# Patient Record
Sex: Female | Born: 1980 | Race: White | Hispanic: No | Marital: Single | Smoking: Current every day smoker
Health system: Southern US, Community
[De-identification: ages and names within clinical notes are randomized; demographics above are authoritative.]

## PROBLEM LIST (undated history)

## (undated) DIAGNOSIS — B1920 Unspecified viral hepatitis C without hepatic coma: Secondary | ICD-10-CM

## (undated) DIAGNOSIS — F419 Anxiety disorder, unspecified: Secondary | ICD-10-CM

---

## 2010-12-21 ENCOUNTER — Emergency Department (HOSPITAL_COMMUNITY)
Admission: EM | Admit: 2010-12-21 | Discharge: 2010-12-21 | Disposition: A | Discharge status: Home / Self Care | Payer: Medicaid Other | Attending: Emergency Medicine | Admitting: Emergency Medicine

## 2010-12-21 DIAGNOSIS — N760 Acute vaginitis: Secondary | ICD-10-CM | POA: Insufficient documentation

## 2010-12-21 DIAGNOSIS — N12 Tubulo-interstitial nephritis, not specified as acute or chronic: Secondary | ICD-10-CM | POA: Insufficient documentation

## 2010-12-21 DIAGNOSIS — R6883 Chills (without fever): Secondary | ICD-10-CM | POA: Insufficient documentation

## 2010-12-21 DIAGNOSIS — M545 Low back pain, unspecified: Secondary | ICD-10-CM | POA: Insufficient documentation

## 2010-12-21 DIAGNOSIS — B9689 Other specified bacterial agents as the cause of diseases classified elsewhere: Secondary | ICD-10-CM | POA: Insufficient documentation

## 2010-12-21 DIAGNOSIS — R109 Unspecified abdominal pain: Secondary | ICD-10-CM | POA: Insufficient documentation

## 2010-12-21 DIAGNOSIS — A499 Bacterial infection, unspecified: Secondary | ICD-10-CM | POA: Insufficient documentation

## 2010-12-21 DIAGNOSIS — R110 Nausea: Secondary | ICD-10-CM | POA: Insufficient documentation

## 2010-12-21 LAB — URINE MICROSCOPIC-ADD ON

## 2010-12-21 LAB — URINALYSIS, ROUTINE W REFLEX MICROSCOPIC
Bilirubin Urine: NEGATIVE
Glucose, UA: NEGATIVE mg/dL
Ketones, ur: NEGATIVE mg/dL
pH: 7 (ref 5.0–8.0)

## 2010-12-21 LAB — POCT PREGNANCY, URINE: Preg Test, Ur: NEGATIVE

## 2010-12-21 LAB — WET PREP, GENITAL: Yeast Wet Prep HPF POC: NONE SEEN

## 2014-02-12 ENCOUNTER — Encounter (HOSPITAL_COMMUNITY): Payer: Self-pay | Admitting: Emergency Medicine

## 2014-02-12 DIAGNOSIS — Z3202 Encounter for pregnancy test, result negative: Secondary | ICD-10-CM | POA: Insufficient documentation

## 2014-02-12 DIAGNOSIS — Z8659 Personal history of other mental and behavioral disorders: Secondary | ICD-10-CM | POA: Insufficient documentation

## 2014-02-12 DIAGNOSIS — F1210 Cannabis abuse, uncomplicated: Secondary | ICD-10-CM | POA: Insufficient documentation

## 2014-02-12 DIAGNOSIS — F1120 Opioid dependence, uncomplicated: Secondary | ICD-10-CM | POA: Insufficient documentation

## 2014-02-12 DIAGNOSIS — F1410 Cocaine abuse, uncomplicated: Secondary | ICD-10-CM | POA: Insufficient documentation

## 2014-02-12 LAB — COMPREHENSIVE METABOLIC PANEL
ALBUMIN: 3.7 g/dL (ref 3.5–5.2)
ALK PHOS: 74 U/L (ref 39–117)
ALT: 49 U/L — ABNORMAL HIGH (ref 0–35)
AST: 45 U/L — AB (ref 0–37)
BUN: 11 mg/dL (ref 6–23)
CO2: 25 mEq/L (ref 19–32)
Calcium: 9.8 mg/dL (ref 8.4–10.5)
Chloride: 101 mEq/L (ref 96–112)
Creatinine, Ser: 0.98 mg/dL (ref 0.50–1.10)
GFR calc Af Amer: 88 mL/min — ABNORMAL LOW (ref >90)
GFR calc non Af Amer: 76 mL/min — ABNORMAL LOW (ref >90)
Glucose, Bld: 91 mg/dL (ref 70–99)
POTASSIUM: 4.1 meq/L (ref 3.7–5.3)
SODIUM: 140 meq/L (ref 137–147)
TOTAL PROTEIN: 8.1 g/dL (ref 6.0–8.3)
Total Bilirubin: 0.5 mg/dL (ref 0.3–1.2)

## 2014-02-12 LAB — RAPID URINE DRUG SCREEN, HOSP PERFORMED
Amphetamines: NOT DETECTED
BENZODIAZEPINES: NOT DETECTED
Barbiturates: NOT DETECTED
COCAINE: POSITIVE — AB
OPIATES: POSITIVE — AB
TETRAHYDROCANNABINOL: POSITIVE — AB

## 2014-02-12 LAB — CBC
HEMATOCRIT: 38.5 % (ref 36.0–46.0)
Hemoglobin: 12.8 g/dL (ref 12.0–15.0)
MCH: 28.8 pg (ref 26.0–34.0)
MCHC: 33.2 g/dL (ref 30.0–36.0)
MCV: 86.5 fL (ref 78.0–100.0)
PLATELETS: 278 10*3/uL (ref 150–400)
RBC: 4.45 MIL/uL (ref 3.87–5.11)
RDW: 12.6 % (ref 11.5–15.5)
WBC: 6.7 10*3/uL (ref 4.0–10.5)

## 2014-02-12 LAB — ACETAMINOPHEN LEVEL

## 2014-02-12 LAB — SALICYLATE LEVEL: Salicylate Lvl: <2 mg/dL — ABNORMAL LOW (ref 2.8–20.0)

## 2014-02-12 LAB — ETHANOL

## 2014-02-12 NOTE — ED Notes (Signed)
No answer for triage.

## 2014-02-12 NOTE — ED Notes (Signed)
Pt reports she has probation and just used heroine a few hours ago. She says she is ready to get help.

## 2014-02-13 ENCOUNTER — Emergency Department (HOSPITAL_COMMUNITY)
Admission: EM | Admit: 2014-02-13 | Discharge: 2014-02-14 | Disposition: A | Discharge status: Rehabilitation Facility | Payer: Medicaid Other | Attending: Emergency Medicine | Admitting: Emergency Medicine

## 2014-02-13 DIAGNOSIS — F1120 Opioid dependence, uncomplicated: Secondary | ICD-10-CM

## 2014-02-13 DIAGNOSIS — F1910 Other psychoactive substance abuse, uncomplicated: Secondary | ICD-10-CM

## 2014-02-13 HISTORY — DX: Anxiety disorder, unspecified: F41.9

## 2014-02-13 LAB — URINE MICROSCOPIC-ADD ON

## 2014-02-13 LAB — URINALYSIS, ROUTINE W REFLEX MICROSCOPIC
BILIRUBIN URINE: NEGATIVE
Glucose, UA: NEGATIVE mg/dL
HGB URINE DIPSTICK: NEGATIVE
Ketones, ur: NEGATIVE mg/dL
Nitrite: NEGATIVE
PH: 5.5 (ref 5.0–8.0)
Protein, ur: NEGATIVE mg/dL
SPECIFIC GRAVITY, URINE: 1.01 (ref 1.005–1.030)
Urobilinogen, UA: 0.2 mg/dL (ref 0.0–1.0)

## 2014-02-13 LAB — PREGNANCY, URINE: Preg Test, Ur: NEGATIVE

## 2014-02-13 MED ORDER — METHOCARBAMOL 500 MG PO TABS
500.0000 mg | ORAL_TABLET | Freq: Three times a day (TID) | ORAL | Status: DC | PRN
  Administered 2014-02-13: 500 mg via ORAL
  Filled 2014-02-13: qty 1

## 2014-02-13 MED ORDER — ONDANSETRON 4 MG PO TBDP: 4.0000 mg | ORAL_TABLET | Freq: Four times a day (QID) | ORAL | Status: DC | PRN

## 2014-02-13 MED ORDER — ALUM & MAG HYDROXIDE-SIMETH 200-200-20 MG/5ML PO SUSP: 30.0000 mL | ORAL | Status: DC | PRN

## 2014-02-13 MED ORDER — HYDROXYZINE HCL 25 MG PO TABS: 25.0000 mg | ORAL_TABLET | Freq: Four times a day (QID) | ORAL | Status: DC | PRN

## 2014-02-13 MED ORDER — NICOTINE 21 MG/24HR TD PT24
21.0000 mg | MEDICATED_PATCH | Freq: Every day | TRANSDERMAL | Status: DC
  Administered 2014-02-13: 21 mg via TRANSDERMAL
  Filled 2014-02-13: qty 1

## 2014-02-13 MED ORDER — NAPROXEN 250 MG PO TABS: 500.0000 mg | ORAL_TABLET | Freq: Two times a day (BID) | ORAL | Status: DC | PRN

## 2014-02-13 MED ORDER — CLONIDINE HCL 0.1 MG PO TABS: 0.1000 mg | ORAL_TABLET | Freq: Every day | ORAL | Status: DC

## 2014-02-13 MED ORDER — DICYCLOMINE HCL 20 MG PO TABS
20.0000 mg | ORAL_TABLET | Freq: Four times a day (QID) | ORAL | Status: DC | PRN
  Administered 2014-02-13: 20 mg via ORAL
  Filled 2014-02-13: qty 1

## 2014-02-13 MED ORDER — LOPERAMIDE HCL 2 MG PO CAPS: 2.0000 mg | ORAL_CAPSULE | ORAL | Status: DC | PRN

## 2014-02-13 MED ORDER — CLONIDINE HCL 0.1 MG PO TABS
0.1000 mg | ORAL_TABLET | Freq: Four times a day (QID) | ORAL | Status: DC
  Administered 2014-02-13 (×2): 0.1 mg via ORAL
  Filled 2014-02-13 (×2): qty 1

## 2014-02-13 MED ORDER — CLONIDINE HCL 0.1 MG PO TABS: 0.1000 mg | ORAL_TABLET | ORAL | Status: DC

## 2014-02-13 NOTE — BH Assessment (Signed)
[facility] Assessment Progress Note  Pt is under consideration for admission to [facility].  At 16:08 I called in response to their concerns and spoke to [place].  I informed her that pt is reportedly represented by an unnamed [employer] administrator in [employer] for her upcoming court date.  Joni Reining reports that pt's vital signs will need to be addressed.  Pt's nurse at [facility] has been informed, and she will discuss the matter with the EDP.  I agreed to continue checking vital signs posted in EPIC.  Doylene Canning, MA Triage Specialist 02/13/2014 @ 16:22

## 2014-02-13 NOTE — BH Assessment (Signed)
Faxed updated list of vitals to [facility].  Harlin Rain Ria Comment, [facility] Triage Specialist [phone]

## 2014-02-13 NOTE — BH Assessment (Signed)
B[facility] Assessment Progress Note  Joni Reining at [facility] reports that generally pts must have a pulse of at least 70 and blood pressure of at least 110/70 in order for [facility] to be able to manage them.  At 17:45 I spoke to pt's nurse at [facility], and informed her.   Doylene Canning, MA  Triage Specialist  02/13/2014 @ 17:51

## 2014-02-13 NOTE — BH Assessment (Addendum)
Tele Assessment Note   Kathleen Frederick is an 33 y.o. female, single , Caucasian who presents unaccompanied to [facility] requesting detoxification from heroin. Pt reports she has been using approximately 2-3 grams of heroin intravenously daily for the past three years. She denies using cocaine even though UDS is positive. Pt reports smoking marijuana rarely and says she smoke one blunt a few days ago but before that it had been several months. Pt reports she has a history of withdrawal including cramps, sweats, chills, nausea, vomiting and diarrhea. She denies history of seizures. She denies regular alcohol use. Pt states she is not prescribed any medications at this time but says she has been on Paxil and Xanax in the past for anxiety. Pt reports her longest period of sobriety was 120 when she was incarcerated for drug offenses. Pt denies depressive symptoms and describes her mood as "pretty good." She denies current suicidal ideation or any history of suicidal gestures. She denies intentional self-injurious behaviors. She denies homicidal ideation or history of violence. She denies any psychotic symptoms.   Pt reports she is seeking treatment at this time because she talked to her probation officer and decided her life has become unmanageable. She reports she has charges for possession of drug paraphernalia. She is living alone in a hotel. She says she is isolating herself and is tired all the time. She state she twin four year old boys who are being care for by her mother. She feels bad she cannot care for them herself.Patient reports that she went to detox program back in February at [facility] and was clean for 54 days. She has had outpatient treatment in the past for anxiety but currently has not providers.   Pt is dressed on a hospital gown, drowsy, oriented x4 with slow, slurred speech and slow motor behavior. Eye contact is poor as Pt has difficulty keeping her eyes open because she is  drowsy. Pt's mood is guilty and affect is congruent with mood. Thought process is coherent and relevant. There is no indication Pt is currently responding to internal stimuli or experiencing delusional thought content. Pt states she is ready for treatment.  Axis I: 304.00 Opioid Use Disorder, Severe Axis II: Deferred Axis III:  Past Medical History  Diagnosis Date  . Anxiety    Axis IV: economic problems, housing problems, occupational problems, other psychosocial or environmental problems and problems related to legal system/crime Axis V: GAF=35  Past Medical History:  Past Medical History  Diagnosis Date  . Anxiety     No past surgical history on file.  Family History: No family history on file.  Social History:  has no tobacco, alcohol, and drug history on file.  Additional Social History:  Alcohol / Drug Use Pain Medications: Denies abuse Prescriptions: Denies abuse Over the Counter:  Denies abuse History of alcohol / drug use?: Yes (UDS positive for cocaine but Pt denies use) Longest period of sobriety (when/how long): 120 while incarcerated Negative Consequences of Use: Financial;Legal;Personal relationships;Work / School Withdrawal Symptoms: Diarrhea;Nausea / Vomiting;Sweats;Cramps;Fever / Chills Substance #1 Name of Substance 1: Heroin (I.V.) 1 - Age of First Use: 30 1 - Amount (size/oz): 2-3 grams 1 - Frequency: daily 1 - Duration: 3 years 1 - Last Use / Amount: 02/12/14, 2 grams Substance #2 Name of Substance 2: Marijuana 2 - Age of First Use: 18 2 - Amount (size/oz): 1 blunt 2 - Frequency: Rarely 2 - Duration: off and on for years 2 -  Last Use / Amount: 02/07/14  CIWA: CIWA-Ar BP: 86/53 mmHg Pulse Rate: 79 COWS: Clinical Opiate Withdrawal Scale (COWS) Resting Pulse Rate: Pulse Rate 80 or below Sweating: No report of chills or flushing Restlessness: Able to sit still Pupil Size: Pupils pinned or normal size for room light Bone or Joint Aches: Not  present Runny Nose or Tearing: Not present GI Upset: No GI symptoms Tremor: No tremor Yawning: No yawning Anxiety or Irritability: None Gooseflesh Skin: Skin is smooth COWS Total Score: 0  Allergies: No Known Allergies  Home Medications:  (Not in a hospital admission)  OB/GYN Status:  Patient's last menstrual period was 11/01/2013.  General Assessment Data Location of Assessment: [facility] ED Is this a Tele or Face-to-Face Assessment?: Tele Assessment Is this an Initial Assessment or a Re-assessment for this encounter?: Initial Assessment Living Arrangements: Other (Comment) (Staying in a motel) Can pt return to current living arrangement?: Yes Admission Status: Voluntary Is patient capable of signing voluntary admission?: Yes Transfer from: Acute Hospital Referral Source: Self/Family/Friend     [facility] Crisis Care Plan Living Arrangements: Other (Comment) (Staying in a motel) Name of Psychiatrist: None Name of Therapist: None  Education Status Is patient currently in school?: No Current Grade: NA Highest grade of school patient has completed: NA Name of school: NA Contact person: NA  Risk to self Suicidal Ideation: No Suicidal Intent: No Is patient at risk for suicide?: No Suicidal Plan?: No Access to Means: No What has been your use of drugs/alcohol within the last 12 months?: Pt reports using heroin daily Previous Attempts/Gestures: No How many times?: 0 Other Self Harm Risks: None Triggers for Past Attempts: None known Intentional Self Injurious Behavior: None Family Suicide History: No Recent stressful life event(s): Financial Problems;Legal Issues Persecutory voices/beliefs?: No Depression: Yes Depression Symptoms: Despondent;Isolating;Fatigue;Guilt;Loss of interest in usual pleasures Substance abuse history and/or treatment for substance abuse?: Yes Suicide prevention information given to non-admitted patients: Not applicable  Risk to Others Homicidal  Ideation: No Thoughts of Harm to Others: No Current Homicidal Intent: No Current Homicidal Plan: No Access to Homicidal Means: No Identified Victim: None History of harm to others?: No Assessment of Violence: None Noted Violent Behavior Description: None Does patient have access to weapons?: No Criminal Charges Pending?: Yes Describe Pending Criminal Charges: Drug paraphernalia Does patient have a court date: Yes (Date unknown) Court Date:  (Date unknown)  Psychosis Hallucinations: None noted Delusions: None noted  Mental Status Report Appear/Hygiene: In hospital gown Eye Contact: Poor Motor Activity: Psychomotor retardation Speech: Slow;Slurred Level of Consciousness: Drowsy Mood: Guilty Affect: Appropriate to circumstance Anxiety Level: Minimal Thought Processes: Coherent;Relevant Judgement: Unimpaired Orientation: Person;Place;Time;Situation Obsessive Compulsive Thoughts/Behaviors: None  Cognitive Functioning Concentration: Decreased Memory: Recent Intact;Remote Intact IQ: Average Insight: Fair Impulse Control: Good Appetite: Fair Weight Loss: 0 Weight Gain: 0 Sleep: No Change Total Hours of Sleep: 8 Vegetative Symptoms: None  ADLScreening [facility]) Patient's cognitive ability adequate to safely complete daily activities?: Yes Patient able to express need for assistance with ADLs?: Yes Independently performs ADLs?: Yes (appropriate for developmental age)  Prior Inpatient Therapy Prior Inpatient Therapy: No Prior Therapy Dates: NA Prior Therapy Facilty/Provider(s): NA Reason for Treatment: NA  Prior Outpatient Therapy Prior Outpatient Therapy: No Prior Therapy Dates: NA Prior Therapy Facilty/Provider(s): NA Reason for Treatment: NA  ADL Screening (condition at time of admission) Patient's cognitive ability adequate to safely complete daily activities?: Yes Is the patient deaf or have difficulty hearing?: No Does the patient have  difficulty seeing,  even when wearing glasses/contacts?: No Does the patient have difficulty concentrating, remembering, or making decisions?: No Patient able to express need for assistance with ADLs?: Yes Does the patient have difficulty dressing or bathing?: No Independently performs ADLs?: Yes (appropriate for developmental age) Does the patient have difficulty walking or climbing stairs?: No Weakness of Legs: None Weakness of Arms/Hands: None  Home Assistive Devices/Equipment Home Assistive Devices/Equipment: None    Abuse/Neglect Assessment (Assessment to be complete while patient is alone) Physical Abuse: Denies Verbal Abuse: Denies Sexual Abuse: Denies Exploitation of patient/patient's resources: Denies Self-Neglect: Denies Values / Beliefs Cultural Requests During Hospitalization: None Spiritual Requests During Hospitalization: None   Advance Directives (For Healthcare) Advance Directive: Patient does not have advance directive;Patient would not like information Pre-existing out of facility DNR order (yellow form or pink MOST form): No Nutrition Screen- [facility] Adult/WL/AP Patient's home diet: Regular  Additional Information 1:1 In Past 12 Months?: No CIRT Risk: No Elopement Risk: No Does patient have medical clearance?: Yes     Disposition: Consulted with Alberteen Sam, NP who says Pt meets criteria for inpatient detoxification. Contact [facility], [facility] and other treatment facilities.  Disposition Initial Assessment Completed for this Encounter: Yes Disposition of Patient: Referred to Patient referred to: [facility];Other (Comment) ([facility] and other treatment facilities)  Harlin Rain Patsy [place], [facility], [facility] Triage Specialist [phone]   Harlin Rain Patsy [place]. 02/13/2014 4:43 AM

## 2014-02-13 NOTE — BH Assessment (Signed)
[facility] at capacity. Faxed clinical information to [facility] and awaiting response.  Harlin Rain Ria Comment, [facility] Triage Specialist [phone]

## 2014-02-13 NOTE — BH Assessment (Signed)
[facility] called and said Pt's blood pressure is too low to be admitted to their facility at this time. They will consider Pt when vitals "are normal for a couple of hours." They ask vitals and corrected pre-screen form be faxed with corrected blood pressure.  Harlin Rain Ria Comment, [facility] Triage Specialist [phone]

## 2014-02-13 NOTE — ED Provider Notes (Addendum)
CSN: [id number]     Arrival date & time 02/12/14  2055 History   First MD Initiated Contact with Patient 02/13/14 [id number]     Chief Complaint  Patient presents with  . Addiction Problem     (Consider location/radiation/quality/duration/timing/severity/associated sxs/prior Treatment) HPI 33 year old female presents to emergency room requesting help with detox from heroine.  Patient reports that she uses multiple substances, but is unable to quit heroin on her own.  Patient reports that she went to detox program back in February at [facility] and was clean for 54 days.  She reports today after speaking with her parole officer she realized that she needed to get clean.  Her next court date is not until June 25.  Patient denies any medical problems.  She reports that she is starting to get some bone pain that she thinks his withdrawal symptoms.  She last used heroin yesterday afternoon around 4 or 5 PM.  She has history of anxiety but no other significant psychiatric history.  Patient reports that she uses heroin daily, she's an IV heroin user Past Medical History  Diagnosis Date  . Anxiety    No past surgical history on file. No family history on file. History  Substance Use Topics  . Smoking status: Not on file  . Smokeless tobacco: Not on file  . Alcohol Use: Not on file   OB History   Grav Para Term Preterm Abortions TAB SAB Ect Mult Living                 Review of Systems  See History of Present Illness; otherwise all other systems are reviewed and negative   Allergies  Review of patient's allergies indicates no known allergies.  Home Medications   Prior to Admission medications   Not on File   BP 86/53  Pulse 79  Temp(Src) 97.6 F (36.4 C) (Oral)  Resp 18  Wt 188 lb (85.276 kg)  SpO2 100%  LMP 11/01/2013 Physical Exam  Nursing note and vitals reviewed. Constitutional: She is oriented to person, place, and time. She appears well-developed and well-nourished.   HENT:  Head: Normocephalic and atraumatic.  Nose: Nose normal.  Mouth/Throat: Oropharynx is clear and moist.  Eyes: Conjunctivae and EOM are normal. Pupils are equal, round, and reactive to light.  Neck: Normal range of motion. Neck supple. No JVD present. No tracheal deviation present. No thyromegaly present.  Cardiovascular: Normal rate, regular rhythm, normal heart sounds and intact distal pulses.  Exam reveals no gallop and no friction rub.   No murmur heard. Pulmonary/Chest: Effort normal and breath sounds normal. No stridor. No respiratory distress. She has no wheezes. She has no rales. She exhibits no tenderness.  Abdominal: Soft. Bowel sounds are normal. She exhibits no distension and no mass. There is no tenderness. There is no rebound and no guarding.  Musculoskeletal: Normal range of motion. She exhibits no edema and no tenderness.  Lymphadenopathy:    She has no cervical adenopathy.  Neurological: She is alert and oriented to person, place, and time. She has normal reflexes. No cranial nerve deficit. She exhibits normal muscle tone. Coordination normal.  Somnolent but arousable  Skin: Skin is warm and dry. No rash noted. No erythema. No pallor.  Psychiatric: She has a normal mood and affect. Her behavior is normal. Judgment and thought content normal.    ED Course  Procedures (including critical care time) Labs Review Labs Reviewed  COMPREHENSIVE METABOLIC PANEL - Abnormal; Notable for the  following:    AST 45 (*)    ALT 49 (*)    GFR calc non Af Amer 76 (*)    GFR calc Af Amer 88 (*)    All other components within normal limits  SALICYLATE LEVEL - Abnormal; Notable for the following:    Salicylate Lvl <2.0 (*)    All other components within normal limits  URINE RAPID DRUG SCREEN (HOSP PERFORMED) - Abnormal; Notable for the following:    Opiates POSITIVE (*)    Cocaine POSITIVE (*)    Tetrahydrocannabinol POSITIVE (*)    All other components within normal limits   CBC  ETHANOL  ACETAMINOPHEN LEVEL  URINALYSIS, ROUTINE W REFLEX MICROSCOPIC  PREGNANCY, URINE    Imaging Review No results found.   EKG Interpretation None      MDM   Final diagnoses:  Polysubstance abuse  Heroin dependence    33 year old female with polysubstance abuse who is requesting help with detox off of heroin.  Will have [facility] evaluate the patient for possible placement.  Patient not in withdrawal at this time.  Holding orders have been placed.  Patient does not have any SI or HI, is here voluntarily and can leave at any time.    Olivia Mackie, MD 02/13/14 2549  Olivia Mackie, MD 02/13/14 [phone]

## 2014-02-13 NOTE — ED Provider Notes (Signed)
Patient denies suicidal or homicidal ideation denies hallucinations states is here for heroin detox; patient is not lightheaded when standing up at the bedside with systolic blood pressure 100. Placement pending. [id number]  Hurman Horn, MD 02/14/14 [phone]

## 2014-02-13 NOTE — BH Assessment (Signed)
Received call for assessment. Spoke to Marisa Severin, MD who said Pt is requesting detox from heroin after speaking to her probation officer today. Tele-assessment will be initiated.  Harlin Rain Ria Comment, [facility] Triage Specialist [phone]

## 2014-02-13 NOTE — ED Notes (Signed)
Pt. Starting to feel withdraws: hot, restless, mildly agitated. Asks that we turn down the air or provide a fan.  Was able to provide a fan and turn down the air for the patient.

## 2014-02-13 NOTE — BH Assessment (Addendum)
[facility]HH Assessment Progress Note  Update:  Faxed referral to [facility] again with new blood pressure readings per their request as well as completed new pre-screen per their request for consideration for detox.  Referral also faxed to [facility], as detox beds available per [facility].  [facility] will follow up with referral.  Casimer Lanius, MS, [facility] Licensed Professional Counselor Triage Specialist

## 2014-02-13 NOTE — ED Notes (Signed)
Pt here to detox off of heroin. Pt states she used less than 12hrs ago. Pt drifting in and out of sleep. Denies any pain.

## 2014-02-14 MED ORDER — ZOLPIDEM TARTRATE 5 MG PO TABS
10.0000 mg | ORAL_TABLET | Freq: Every evening | ORAL | Status: DC | PRN
  Administered 2014-02-14: 10 mg via ORAL
  Filled 2014-02-14: qty 2

## 2014-02-14 NOTE — ED Notes (Signed)
Called [facility] to inform patient is now leaving.

## 2014-02-14 NOTE — ED Notes (Signed)
Patient going to [facility] via [place]

## 2014-02-14 NOTE — ED Notes (Addendum)
RN now assuming care of this patient

## 2014-02-14 NOTE — BH Assessment (Signed)
Called [facility] for referral update. Per Alinda Money, Pt is accepted and a bed is available. Notified Dr. Marisa Severin and Zella Ball, RN of acceptance. [facility] wants to be called at [phone] when Pt is ready for transport.  Harlin Rain Ria Comment, [facility] Triage Specialist [phone]

## 2014-02-14 NOTE — Discharge Instructions (Signed)
Please follow up with [facility] as directed.   Finding Treatment for Alcohol and Drug Addiction It can be hard to find the right place to get professional treatment. Here are some important things to consider:  There are different types of treatment to choose from.  Some programs are live-in (residential) while others are not (outpatient). Sometimes a combination is offered.  No single type of program is right for everyone.  Most treatment programs involve a combination of education, counseling, and a 12-step, spiritually-based approach.  There are non-spiritually based programs (not 12-step).  Some treatment programs are government sponsored. They are geared for patients without private insurance.  Treatment programs can vary in many respects such as:  Cost and types of insurance accepted.  Types of on-site medical services offered.  Length of stay, setting, and size.  Overall philosophy of treatment. A person may need specialized treatment or have needs not addressed by all programs. For example, adolescents need treatment appropriate for their age. Other people have secondary disorders that must be managed as well. Secondary conditions can include mental illness, such as depression or diabetes. Often, a period of detoxification from alcohol or drugs is needed. This requires medical supervision and not all programs offer this. THINGS TO CONSIDER WHEN SELECTING A TREATMENT PROGRAM   Is the program certified by the appropriate government agency? Even private programs must be certified and employ certified professionals.  Does the program accept your insurance? If not, can a payment plan be set up?  Is the facility clean, organized, and well run? Do they allow you to speak with graduates who can share their treatment experience with you? Can you tour the facility? Can you meet with staff?  Does the program meet the full range of individual needs?  Does the treatment program address  sexual orientation and physical disabilities? Do they provide age, gender, and culturally appropriate treatment services?  Is treatment available in languages other than English?  Is long-term aftercare support or guidance encouraged and provided?  Is assessment of an individual's treatment plan ongoing to ensure it meets changing needs?  Does the program use strategies to encourage reluctant patients to remain in treatment long enough to increase the likelihood of success?  Does the program offer counseling (individual or group) and other behavioral therapies?  Does the program offer medicine as part of the treatment regimen, if needed?  Is there ongoing monitoring of possible relapse? Is there a defined relapse prevention program? Are services or referrals offered to family members to ensure they understand addiction and the recovery process? This would help them support the recovering individual.  Are 12-step meetings held at the center or is transport available for patients to attend outside meetings? In countries outside of the [place]. and [place], Magazine features editor for contact information for services in your area. Document Released: 08/03/2005 Document Revised: 11/27/2011 Document Reviewed: 02/13/2008 [facility] [place] Patient Information 2014 [place], [place].

## 2014-02-15 ENCOUNTER — Telehealth (HOSPITAL_BASED_OUTPATIENT_CLINIC_OR_DEPARTMENT_OTHER): Payer: Self-pay | Admitting: Emergency Medicine

## 2014-02-15 LAB — URINE CULTURE

## 2014-02-15 NOTE — Progress Notes (Signed)
ED Antimicrobial Stewardship Positive Culture Follow Up   Kathleen Frederick is an 33 y.o. female who presented to [facility] on 02/13/2014 with a chief complaint of heroin detox  Chief Complaint  Patient presents with  . Addiction Problem    Recent Results (from the past 720 hour(s))  URINE CULTURE     Status: None   Collection Time    02/12/14 10:13 PM      Result Value Ref Range Status   Specimen Description URINE, CLEAN CATCH   Final   Special Requests NONE   Final   Culture  Setup Time     Final   Value: 02/13/2014 11:14     Performed at [facility] Count     Final   Value: >=100,000 COLONIES/ML     Performed at [facility]   Culture     Final   Value: ESCHERICHIA COLI     Performed at [facility]   Report Status 02/15/2014 FINAL   Final   Organism ID, Bacteria ESCHERICHIA COLI   Final    [x]  No treatment indicated  [name] who presented for heroin detox - no c/o urinary sx, afebrile, WBC wnl - grew E.coli - likely asymptomatic bacteruria, would not treat.   New antibiotic prescription: No treatment needed  ED Provider: Francee Piccolo, PA-C  Ann Held 02/15/2014, 3:44 PM Infectious Diseases Pharmacist Phone# [phone]

## 2014-02-15 NOTE — Telephone Encounter (Signed)
Post ED Visit - Positive Culture Follow-up  Culture report reviewed by antimicrobial stewardship pharmacist: []  Wes Dulaney, Pharm.D., BCPS []  Celedonio Miyamoto, Pharm.D., BCPS [x]  Georgina Pillion, [address].D., BCPS []  [place], [address].D., BCPS, AAHIVP []  Estella Husk, Pharm.D., BCPS, AAHIVP []  Harvie Junior, Pharm.D.  Positive urine culture Per Victorino Dike Piepenbrink PA-C, no treatment needed and no further patient follow-up is required at this time.  Zeb Comfort 02/15/2014, 4:24 PM

## 2015-12-26 ENCOUNTER — Emergency Department (HOSPITAL_COMMUNITY): Payer: Self-pay | Admitting: Certified Registered"

## 2015-12-26 ENCOUNTER — Inpatient Hospital Stay (HOSPITAL_COMMUNITY)
Admission: EM | Admit: 2015-12-26 | Discharge: 2016-01-05 | DRG: 853 | Disposition: A | Discharge status: Home Health | Payer: Self-pay | Attending: Surgery | Admitting: Surgery

## 2015-12-26 ENCOUNTER — Encounter (HOSPITAL_COMMUNITY): Admission: EM | Disposition: A | Discharge status: Home Health | Payer: Self-pay | Source: Home / Self Care

## 2015-12-26 ENCOUNTER — Encounter (HOSPITAL_COMMUNITY): Payer: Self-pay | Admitting: Emergency Medicine

## 2015-12-26 ENCOUNTER — Inpatient Hospital Stay (HOSPITAL_COMMUNITY): Payer: Self-pay

## 2015-12-26 DIAGNOSIS — J9600 Acute respiratory failure, unspecified whether with hypoxia or hypercapnia: Secondary | ICD-10-CM

## 2015-12-26 DIAGNOSIS — L02219 Cutaneous abscess of trunk, unspecified: Secondary | ICD-10-CM

## 2015-12-26 DIAGNOSIS — D649 Anemia, unspecified: Secondary | ICD-10-CM | POA: Diagnosis present

## 2015-12-26 DIAGNOSIS — N179 Acute kidney failure, unspecified: Secondary | ICD-10-CM | POA: Diagnosis present

## 2015-12-26 DIAGNOSIS — Z452 Encounter for adjustment and management of vascular access device: Secondary | ICD-10-CM

## 2015-12-26 DIAGNOSIS — E876 Hypokalemia: Secondary | ICD-10-CM | POA: Diagnosis not present

## 2015-12-26 DIAGNOSIS — F1910 Other psychoactive substance abuse, uncomplicated: Secondary | ICD-10-CM | POA: Diagnosis present

## 2015-12-26 DIAGNOSIS — A419 Sepsis, unspecified organism: Secondary | ICD-10-CM

## 2015-12-26 DIAGNOSIS — L02211 Cutaneous abscess of abdominal wall: Secondary | ICD-10-CM | POA: Diagnosis present

## 2015-12-26 DIAGNOSIS — B9562 Methicillin resistant Staphylococcus aureus infection as the cause of diseases classified elsewhere: Secondary | ICD-10-CM | POA: Diagnosis present

## 2015-12-26 DIAGNOSIS — Z6841 Body Mass Index (BMI) 40.0 and over, adult: Secondary | ICD-10-CM

## 2015-12-26 DIAGNOSIS — F419 Anxiety disorder, unspecified: Secondary | ICD-10-CM | POA: Diagnosis present

## 2015-12-26 DIAGNOSIS — E669 Obesity, unspecified: Secondary | ICD-10-CM | POA: Diagnosis present

## 2015-12-26 DIAGNOSIS — R6521 Severe sepsis with septic shock: Secondary | ICD-10-CM | POA: Diagnosis present

## 2015-12-26 DIAGNOSIS — F17200 Nicotine dependence, unspecified, uncomplicated: Secondary | ICD-10-CM | POA: Diagnosis present

## 2015-12-26 DIAGNOSIS — D6489 Other specified anemias: Secondary | ICD-10-CM | POA: Diagnosis present

## 2015-12-26 DIAGNOSIS — L03319 Cellulitis of trunk, unspecified: Secondary | ICD-10-CM | POA: Diagnosis present

## 2015-12-26 DIAGNOSIS — M726 Necrotizing fasciitis: Secondary | ICD-10-CM | POA: Diagnosis present

## 2015-12-26 DIAGNOSIS — B1920 Unspecified viral hepatitis C without hepatic coma: Secondary | ICD-10-CM | POA: Diagnosis present

## 2015-12-26 DIAGNOSIS — I509 Heart failure, unspecified: Secondary | ICD-10-CM | POA: Diagnosis present

## 2015-12-26 HISTORY — PX: INCISION AND DRAINAGE ABSCESS: SHX5864

## 2015-12-26 HISTORY — DX: Unspecified viral hepatitis C without hepatic coma: B19.20

## 2015-12-26 LAB — POCT I-STAT 3, ART BLOOD GAS (G3+)
ACID-BASE DEFICIT: 7 mmol/L — AB (ref 0.0–2.0)
Bicarbonate: 20.6 mEq/L (ref 20.0–24.0)
O2 SAT: 100 %
Patient temperature: 93.4
TCO2: 22 mmol/L (ref 0–100)
pCO2 arterial: 40.8 mmHg (ref 35.0–45.0)
pH, Arterial: 7.295 — ABNORMAL LOW (ref 7.350–7.450)
pO2, Arterial: 349 mmHg — ABNORMAL HIGH (ref 80.0–100.0)

## 2015-12-26 LAB — COMPREHENSIVE METABOLIC PANEL
ALT: 42 U/L (ref 14–54)
ANION GAP: 15 (ref 5–15)
AST: 69 U/L — ABNORMAL HIGH (ref 15–41)
Albumin: 2.6 g/dL — ABNORMAL LOW (ref 3.5–5.0)
Alkaline Phosphatase: 93 U/L (ref 38–126)
BUN: 26 mg/dL — ABNORMAL HIGH (ref 6–20)
CHLORIDE: 98 mmol/L — AB (ref 101–111)
CO2: 22 mmol/L (ref 22–32)
Calcium: 8.6 mg/dL — ABNORMAL LOW (ref 8.9–10.3)
Creatinine, Ser: 2.35 mg/dL — ABNORMAL HIGH (ref 0.44–1.00)
GFR calc non Af Amer: 26 mL/min — ABNORMAL LOW (ref >60)
GFR, EST AFRICAN AMERICAN: 30 mL/min — AB (ref >60)
GLUCOSE: 112 mg/dL — AB (ref 65–99)
POTASSIUM: 3.5 mmol/L (ref 3.5–5.1)
SODIUM: 135 mmol/L (ref 135–145)
Total Bilirubin: 1.1 mg/dL (ref 0.3–1.2)
Total Protein: 6.5 g/dL (ref 6.5–8.1)

## 2015-12-26 LAB — TYPE AND SCREEN
ABO/RH(D): A POS
ANTIBODY SCREEN: NEGATIVE

## 2015-12-26 LAB — I-STAT CG4 LACTIC ACID, ED
LACTIC ACID, VENOUS: 0.67 mmol/L (ref 0.5–2.0)
Lactic Acid, Venous: 1.64 mmol/L (ref 0.5–2.0)

## 2015-12-26 LAB — URINALYSIS, ROUTINE W REFLEX MICROSCOPIC
Bilirubin Urine: NEGATIVE
Glucose, UA: NEGATIVE mg/dL
Hgb urine dipstick: NEGATIVE
KETONES UR: NEGATIVE mg/dL
LEUKOCYTES UA: NEGATIVE
NITRITE: NEGATIVE
PH: 5 (ref 5.0–8.0)
PROTEIN: NEGATIVE mg/dL
Specific Gravity, Urine: 1.007 (ref 1.005–1.030)

## 2015-12-26 LAB — CBC
HEMATOCRIT: 31.6 % — AB (ref 36.0–46.0)
Hemoglobin: 10.6 g/dL — ABNORMAL LOW (ref 12.0–15.0)
MCH: 27.4 pg (ref 26.0–34.0)
MCHC: 33.5 g/dL (ref 30.0–36.0)
MCV: 81.7 fL (ref 78.0–100.0)
Platelets: 245 10*3/uL (ref 150–400)
RBC: 3.87 MIL/uL (ref 3.87–5.11)
RDW: 13.5 % (ref 11.5–15.5)
WBC: 19.9 10*3/uL — ABNORMAL HIGH (ref 4.0–10.5)

## 2015-12-26 LAB — I-STAT BETA HCG BLOOD, ED (MC, WL, AP ONLY)

## 2015-12-26 SURGERY — INCISION AND DRAINAGE, ABSCESS
Anesthesia: General | Site: Flank | Laterality: Left

## 2015-12-26 MED ORDER — ONDANSETRON HCL 4 MG/2ML IJ SOLN: 4.0000 mg | Freq: Four times a day (QID) | INTRAMUSCULAR | Status: DC | PRN

## 2015-12-26 MED ORDER — SODIUM CHLORIDE 0.9 % IV SOLN
25.0000 ug/h | INTRAVENOUS | Status: AC
  Administered 2015-12-26: 250 ug/h via INTRAVENOUS
  Filled 2015-12-26: qty 50

## 2015-12-26 MED ORDER — CLINDAMYCIN PHOSPHATE 900 MG/50ML IV SOLN
900.0000 mg | Freq: Three times a day (TID) | INTRAVENOUS | Status: DC
  Administered 2015-12-27 – 2015-12-28 (×5): 900 mg via INTRAVENOUS
  Filled 2015-12-26 (×6): qty 50

## 2015-12-26 MED ORDER — PIPERACILLIN-TAZOBACTAM 3.375 G IVPB
3.3750 g | Freq: Three times a day (TID) | INTRAVENOUS | Status: DC
  Administered 2015-12-27 – 2015-12-29 (×8): 3.375 g via INTRAVENOUS
  Filled 2015-12-26 (×9): qty 50

## 2015-12-26 MED ORDER — HYDROMORPHONE HCL 1 MG/ML IJ SOLN
1.0000 mg | INTRAMUSCULAR | Status: DC | PRN
  Administered 2015-12-31 – 2016-01-02 (×17): 1 mg via INTRAVENOUS
  Filled 2015-12-26 (×18): qty 1

## 2015-12-26 MED ORDER — FENTANYL CITRATE (PF) 250 MCG/5ML IJ SOLN
INTRAMUSCULAR | Status: AC
  Filled 2015-12-26: qty 5

## 2015-12-26 MED ORDER — NOREPINEPHRINE BITARTRATE 1 MG/ML IV SOLN
0.0000 ug/min | INTRAVENOUS | Status: AC
  Administered 2015-12-26: 3 ug/min via INTRAVENOUS
  Filled 2015-12-26: qty 4

## 2015-12-26 MED ORDER — SODIUM CHLORIDE 0.9 % IV SOLN
INTRAVENOUS | Status: DC | PRN
  Administered 2015-12-26 (×2): via INTRAVENOUS

## 2015-12-26 MED ORDER — ONDANSETRON 4 MG PO TBDP
4.0000 mg | ORAL_TABLET | Freq: Four times a day (QID) | ORAL | Status: DC | PRN
  Filled 2015-12-26: qty 1

## 2015-12-26 MED ORDER — PROPOFOL 10 MG/ML IV BOLUS
INTRAVENOUS | Status: AC
Start: 2015-12-26 — End: 2015-12-26
  Filled 2015-12-26: qty 20

## 2015-12-26 MED ORDER — ANTISEPTIC ORAL RINSE SOLUTION (CORINZ)
7.0000 mL | Freq: Four times a day (QID) | OROMUCOSAL | Status: DC
  Administered 2015-12-27 – 2015-12-30 (×18): 7 mL via OROMUCOSAL

## 2015-12-26 MED ORDER — ENOXAPARIN SODIUM 40 MG/0.4ML ~~LOC~~ SOLN
40.0000 mg | SUBCUTANEOUS | Status: DC
  Administered 2015-12-27 – 2016-01-01 (×6): 40 mg via SUBCUTANEOUS
  Filled 2015-12-26 (×6): qty 0.4

## 2015-12-26 MED ORDER — MIDAZOLAM HCL 5 MG/5ML IJ SOLN
INTRAMUSCULAR | Status: DC | PRN
  Administered 2015-12-26: 2 mg via INTRAVENOUS

## 2015-12-26 MED ORDER — VANCOMYCIN HCL IN DEXTROSE 1-5 GM/200ML-% IV SOLN
1000.0000 mg | Freq: Once | INTRAVENOUS | Status: AC
Start: 2015-12-26 — End: 2015-12-26
  Administered 2015-12-26: 1000 mg via INTRAVENOUS
  Filled 2015-12-26: qty 200

## 2015-12-26 MED ORDER — PIPERACILLIN-TAZOBACTAM 3.375 G IVPB 30 MIN
3.3750 g | Freq: Once | INTRAVENOUS | Status: AC
  Administered 2015-12-26: 3.375 g via INTRAVENOUS
  Filled 2015-12-26: qty 50

## 2015-12-26 MED ORDER — SUCCINYLCHOLINE CHLORIDE 20 MG/ML IJ SOLN
INTRAMUSCULAR | Status: DC | PRN
  Administered 2015-12-26: 80 mg via INTRAVENOUS

## 2015-12-26 MED ORDER — ROCURONIUM BROMIDE 100 MG/10ML IV SOLN
INTRAVENOUS | Status: DC | PRN
  Administered 2015-12-26: 50 mg via INTRAVENOUS

## 2015-12-26 MED ORDER — SODIUM CHLORIDE 0.9 % IV SOLN
1250.0000 mg | INTRAVENOUS | Status: DC
  Administered 2015-12-27 – 2015-12-28 (×2): 1250 mg via INTRAVENOUS
  Filled 2015-12-26 (×2): qty 1250

## 2015-12-26 MED ORDER — SODIUM CHLORIDE 0.9 % IV BOLUS (SEPSIS)
1000.0000 mL | INTRAVENOUS | Status: AC
  Administered 2015-12-26 (×3): 1000 mL via INTRAVENOUS

## 2015-12-26 MED ORDER — LACTATED RINGERS IV SOLN
INTRAVENOUS | Status: DC | PRN
  Administered 2015-12-26: 22:00:00 via INTRAVENOUS

## 2015-12-26 MED ORDER — PANTOPRAZOLE SODIUM 40 MG IV SOLR
40.0000 mg | INTRAVENOUS | Status: DC
  Administered 2015-12-27 – 2015-12-30 (×5): 40 mg via INTRAVENOUS
  Filled 2015-12-26 (×5): qty 40

## 2015-12-26 MED ORDER — MIDAZOLAM HCL 2 MG/2ML IJ SOLN
INTRAMUSCULAR | Status: AC
  Filled 2015-12-26: qty 2

## 2015-12-26 MED ORDER — SODIUM CHLORIDE 0.9 % IV BOLUS (SEPSIS)
1000.0000 mL | Freq: Once | INTRAVENOUS | Status: AC
  Administered 2015-12-26: 1000 mL via INTRAVENOUS

## 2015-12-26 MED ORDER — 0.9 % SODIUM CHLORIDE (POUR BTL) OPTIME
TOPICAL | Status: DC | PRN
  Administered 2015-12-26: 1000 mL

## 2015-12-26 MED ORDER — PHENYLEPHRINE HCL 10 MG/ML IJ SOLN
INTRAMUSCULAR | Status: DC | PRN
  Administered 2015-12-26 (×3): 80 ug via INTRAVENOUS

## 2015-12-26 MED ORDER — PROPOFOL 10 MG/ML IV BOLUS
INTRAVENOUS | Status: DC | PRN
  Administered 2015-12-26: 200 mg via INTRAVENOUS

## 2015-12-26 MED ORDER — CHLORHEXIDINE GLUCONATE 0.12% ORAL RINSE (MEDLINE KIT)
15.0000 mL | Freq: Two times a day (BID) | OROMUCOSAL | Status: DC
  Administered 2015-12-26 – 2015-12-30 (×8): 15 mL via OROMUCOSAL

## 2015-12-26 SURGICAL SUPPLY — 33 items
BNDG GAUZE ELAST 4 BULKY (GAUZE/BANDAGES/DRESSINGS) ×3 IMPLANT
CANISTER SUCTION 2500CC (MISCELLANEOUS) ×3 IMPLANT
COVER SURGICAL LIGHT HANDLE (MISCELLANEOUS) ×3 IMPLANT
DRAPE LAPAROSCOPIC ABDOMINAL (DRAPES) ×3 IMPLANT
DRAPE UTILITY XL STRL (DRAPES) ×6 IMPLANT
DRESSING ALLEVYN LIFE SACRUM (GAUZE/BANDAGES/DRESSINGS) ×3 IMPLANT
DRSG PAD ABDOMINAL 8X10 ST (GAUZE/BANDAGES/DRESSINGS) IMPLANT
ELECT CAUTERY BLADE 6.4 (BLADE) ×3 IMPLANT
ELECT REM PT RETURN 9FT ADLT (ELECTROSURGICAL) ×3
ELECTRODE REM PT RTRN 9FT ADLT (ELECTROSURGICAL) ×1 IMPLANT
GAUZE SPONGE 4X4 12PLY STRL (GAUZE/BANDAGES/DRESSINGS) IMPLANT
GLOVE BIOGEL PI IND STRL 6.5 (GLOVE) ×1 IMPLANT
GLOVE BIOGEL PI INDICATOR 6.5 (GLOVE) ×2
GLOVE SURG SIGNA 7.5 PF LTX (GLOVE) ×3 IMPLANT
GLOVE SURG SS PI 6.5 STRL IVOR (GLOVE) ×3 IMPLANT
GOWN STRL REUS W/ TWL LRG LVL3 (GOWN DISPOSABLE) ×1 IMPLANT
GOWN STRL REUS W/ TWL XL LVL3 (GOWN DISPOSABLE) ×1 IMPLANT
GOWN STRL REUS W/TWL LRG LVL3 (GOWN DISPOSABLE) ×2
GOWN STRL REUS W/TWL XL LVL3 (GOWN DISPOSABLE) ×2
KIT BASIN OR (CUSTOM PROCEDURE TRAY) ×3 IMPLANT
KIT ROOM TURNOVER OR (KITS) ×3 IMPLANT
NS IRRIG 1000ML POUR BTL (IV SOLUTION) ×6 IMPLANT
PACK GENERAL/GYN (CUSTOM PROCEDURE TRAY) ×3 IMPLANT
PAD ABD 8X10 STRL (GAUZE/BANDAGES/DRESSINGS) ×3 IMPLANT
PAD ARMBOARD 7.5X6 YLW CONV (MISCELLANEOUS) ×6 IMPLANT
SOLUTION BETADINE 4OZ (MISCELLANEOUS) ×6 IMPLANT
SPECIMEN JAR SMALL (MISCELLANEOUS) IMPLANT
SPONGE GAUZE 4X4 12PLY STER LF (GAUZE/BANDAGES/DRESSINGS) ×3 IMPLANT
SWAB COLLECTION DEVICE MRSA (MISCELLANEOUS) ×3 IMPLANT
TAPE CLOTH SURG 6X10 WHT LF (GAUZE/BANDAGES/DRESSINGS) ×3 IMPLANT
TOWEL OR 17X24 6PK STRL BLUE (TOWEL DISPOSABLE) ×3 IMPLANT
TOWEL OR 17X26 10 PK STRL BLUE (TOWEL DISPOSABLE) ×3 IMPLANT
TUBE ANAEROBIC SPECIMEN COL (MISCELLANEOUS) ×3 IMPLANT

## 2015-12-26 NOTE — Anesthesia Preprocedure Evaluation (Addendum)
Anesthesia Evaluation  Patient identified by MRN, date of birth, ID band Patient awake    Reviewed: Allergy & Precautions, NPO status , Patient's Chart, lab work & pertinent test results  History of Anesthesia Complications Negative for: history of anesthetic complications  Airway Mallampati: I  TM Distance: >3 FB Neck ROM: Full    Dental  (+) Teeth Intact, Dental Advisory Given   Pulmonary Current Smoker,    breath sounds clear to auscultation       Cardiovascular  Rhythm:Regular     Neuro/Psych PSYCHIATRIC DISORDERS Anxiety negative neurological ROS     GI/Hepatic (+)     substance abuse  cocaine use and IV drug use, Hepatitis -, C  Endo/Other    Renal/GU      Musculoskeletal   Abdominal   Peds  Hematology  (+) anemia ,   Anesthesia Other Findings   Reproductive/Obstetrics                           Anesthesia Physical Anesthesia Plan  ASA: IV and emergent  Anesthesia Plan: General   Post-op Pain Management:    Induction: Intravenous, Rapid sequence and Cricoid pressure planned  Airway Management Planned: Oral ETT  Additional Equipment: Arterial line, CVP and Ultrasound Guidance Line Placement  Intra-op Plan:   Post-operative Plan: Possible Post-op intubation/ventilation and Extubation in OR  Informed Consent: I have reviewed the patients History and Physical, chart, labs and discussed the procedure including the risks, benefits and alternatives for the proposed anesthesia with the patient or authorized representative who has indicated his/her understanding and acceptance.   Dental advisory given  Plan Discussed with: Surgeon and CRNA  Anesthesia Plan Comments:        Anesthesia Quick Evaluation

## 2015-12-26 NOTE — Op Note (Signed)
NAMAlmira Bar:  [name], [name]                ACCOUNT NO.:  [id number]  MEDICAL RECORD NO.:  [id number]  LOCATION:  2S05C                        FACILITY:  [facility]  PHYSICIAN:  Abigail Miyamotoouglas Tabbetha Kutscher, M.D. DATE OF BIRTH:  12-10-1980  DATE OF PROCEDURE:  12/26/2015 DATE OF DISCHARGE:                              OPERATIVE REPORT   PREOPERATIVE DIAGNOSIS:  Left flank abscess with sepsis.  POSTOPERATIVE DIAGNOSIS:  Left flank abscess with sepsis.  PROCEDURE:  Incision, drainage, and sharp debridement of left flank abscess (160 sq. cm of skin and fat).  SURGEON:  Abigail Miyamotoouglas Railey Glad, MD  ANESTHESIA:  General.  ESTIMATED BLOOD LOSS:  50 mL.  INDICATIONS:  This is a 35 year old female with a history of drug abuse who presents to emergency department with left flank pain.  She was found to have a large area of cellulitis and abscess of the left flank. She was showing signs of sepsis with hypotension and a white blood count of 19,000.  Surgery was consulted and she was taken emergently to the operating room.  FINDINGS:  The patient was found to have a large flank abscess. Cultures were obtained with purulence.  There was necrotic skin and underlying fat.  It did not appear to involve the fascia or underlying muscle.  160 sq. cm of skin and subcutaneous tissue was sharply debrided with a scalpel.  PROCEDURE IN DETAIL:  The patient was brought to the operating room, identified as Kathleen Frederick.  She was placed supine on the operating room table and general anesthesia was induced.  Anesthesiology then placed a central line and an arterial line.  The patient was then placed in the right lateral decubitus position.  Her left flank and abdomen were then prepped and draped in usual sterile fashion.  I made a large transverse incision across the flank with a scalpel.  I entered a large abscess cavity.  Purulence was identified and cultured.  The patient's overlying skin was necrotic and small area, but there  was a large amount of necrotic fat underneath this.  I had sharply debrided 160 sq cm of skin and underlying fat going down to the fascia with a scalpel.  The fascia itself appeared to be intact.  The underlying muscle appeared viable as well.  I sharply debrided the surrounding tissue until there was only healthy fat circumferentially.  I then achieved hemostasis with the cautery.  I irrigated the wound with saline.  I then packed the wound with 2 Betadine soaked wet-to-dry saline-soaked Kerlix.  Dry gauze and ABDs were then placed over this.  The patient appeared to tolerate the procedure fairly well.  Blood pressure was maintained with fluids and pressors as controlled by the anesthesiologist.  The patient was then left intubated and taken from the operating room to the intensive care unit. All counts were correct at the end of the procedure.  Plan will be to potentially proceed for a second-look in the operating room in the next 24-48 hours.     Abigail Miyamotoouglas Shavonn Convey, M.D.     DB/MEDQ  D:  12/26/2015  T:  12/26/2015  Job:  [id number]

## 2015-12-26 NOTE — Progress Notes (Signed)
Pharmacy Antibiotic Note  Kathleen Frederick is a 35 y.o. female admitted on 12/26/2015 with pneumonia, sepsis and cellulitis, also noted w/ ARF, s/p I&D in OR for concern for necrotizing fasciitis, found necrotic fat but no involvement in fascia or muscle.  Pharmacy has been consulted for Vancocin and Zosyn dosing.  Plan: Rec'd vanc 1g and Zosyn 3.375g IV in ED. Vancomycin 1250 IV every 24 hours.  Goal trough 15-20 mcg/mL. Zosyn 3.375g IV q8h (4 hour infusion).  Height: 5\' 6"  (167.6 cm) Weight: 264 lb 8.8 oz (120 kg) IBW/kg (Calculated) : 59.3  Temp (24hrs), Avg:97.4 F (36.3 C), Min:97.4 F (36.3 C), Max:97.4 F (36.3 C)   Recent Labs Lab 12/26/15 1638 12/26/15 1647 12/26/15 2128  WBC 19.9*  --   --   CREATININE 2.35*  --   --   LATICACIDVEN  --  1.64 0.67    Estimated Creatinine Clearance: 45.4 mL/min (by C-G formula based on Cr of 2.35).    No Known Allergies    Thank you for allowing pharmacy to be a part of this patient's care.  Kathleen Frederick, PharmD, BCPS  12/26/2015 11:46 PM

## 2015-12-26 NOTE — OR Nursing (Signed)
Due to patient placement demanding an order to be placed for bed assignment/admission, verbal order placed for admission after call received from OR while Dr. Magnus IvanBlackman is in surgery.

## 2015-12-26 NOTE — ED Notes (Signed)
Pt sts abscess to head several days ago; pt now has redness to large area around left side; pt sts recent relapse with heroin for last 2 days

## 2015-12-26 NOTE — Consult Note (Signed)
PULMONARY / CRITICAL CARE MEDICINE   Name: Kathleen Frederick MRN: [id number] DOB: May 12, 1981    ADMISSION DATE:  12/26/2015 CONSULTATION DATE:  12/26/15  REFERRING MD:  Barrie DunkerBlackman, D  CHIEF COMPLAINT:  Severe sepsis, left flank abscess  HISTORY OF PRESENT ILLNESS:   Kathleen Frederick is a 63F who presented to the ED with complaints of left flank pain. She was found to be hypotensive. She has a history of iv drug abuse, hepatitis C and anxiety. Initial labs showed acute renal failure with Cr 2.36 and leukocytosis at 19.9. Blood cultures were drawn. She was given IVF and antibiotics, then taken to the OR urgently for debridement out of concern for necrotizing fasciitis. She was intubated and central line and a-line were placed. Intraoperatively she required intermittent pressor support. The surgeon noted necrotic fat, but did not feel that the underlying fascia or muscle were involved. Postoperatively she was admitted to 2S. She remains intubated.   She is unable to provide any additional history as she is currently intubated, sedated and paralyzed.   PAST MEDICAL HISTORY :  She  has a past medical history of Anxiety and Hepatitis C.  PAST SURGICAL HISTORY: She  has no past surgical history on file.  No Known Allergies  No current facility-administered medications on file prior to encounter.   No current outpatient prescriptions on file prior to encounter.    FAMILY HISTORY:  Her has no family status information on file.   SOCIAL HISTORY: She  reports that she has been smoking.  She does not have any smokeless tobacco history on file. She reports that she uses illicit drugs (Cocaine and IV). She reports that she does not drink alcohol.  REVIEW OF SYSTEMS:   Unable to obtain 2/2 intubated state  VITAL SIGNS: BP 83/57 mmHg  Pulse 90  Temp(Src) 97.4 F (36.3 C) (Oral)  Resp 13  SpO2 91%  HEMODYNAMICS:    VENTILATOR SETTINGS:    INTAKE / OUTPUT:    PHYSICAL EXAMINATION:  General  Well nourished, well developed, obese, intubated, sedated, paralyzed  HEENT No gross abnormalities. ETT/OGT in place  Pulmonary Clear to auscultation bilaterally with no wheezes, rales or ronchi. Vent-assisted effort, symmetrical expansion.   Cardiovascular Normal rate, regular rhythm. S1, s2. No m/r/g. Distal pulses palpable.  Abdomen Soft, non-tender, non-distended, positive bowel sounds, no palpable organomegaly or masses. Normoresonant to percussion.  Musculoskeletal No gross abnormalities  Lymphatics No cervical, supraclavicular or axillary adenopathy.   Neurologic Pupils equal, round, reactive. Exam limited by sedation/analgesia and paralytic  Skin/Integuement Large wound L flank with dressing in place. Multiple bruises and needle marks over upper thighs, lower abdomen. Small pustule R lower abdomen.      LABS:  BMET  Recent Labs Lab 12/26/15 1638  NA 135  K 3.5  CL 98*  CO2 22  BUN 26*  CREATININE 2.35*  GLUCOSE 112*    Electrolytes  Recent Labs Lab 12/26/15 1638  CALCIUM 8.6*    CBC  Recent Labs Lab 12/26/15 1638  WBC 19.9*  HGB 10.6*  HCT 31.6*  PLT 245    Coag's No results for input(s): APTT, INR in the last 168 hours.  Sepsis Markers  Recent Labs Lab 12/26/15 1647 12/26/15 2128  LATICACIDVEN 1.64 0.67    ABG No results for input(s): PHART, PCO2ART, PO2ART in the last 168 hours.  Liver Enzymes  Recent Labs Lab 12/26/15 1638  AST 69*  ALT 42  ALKPHOS 93  BILITOT 1.1  ALBUMIN 2.6*  Cardiac Enzymes No results for input(s): TROPONINI, PROBNP in the last 168 hours.  Glucose No results for input(s): GLUCAP in the last 168 hours.  Imaging No results found.   STUDIES:    CULTURES: Blood culture 12/26/15 Wound culture 12/26/15  ANTIBIOTICS: Vanc 4/9 >> Zosyn 4/9 >>  SIGNIFICANT EVENTS: I&D left flank abscess  LINES/TUBES: A-line 4/9 CVC 4/9 ETT 4/9 Foley 4/9  DISCUSSION: 68F w/ severe sepsis / septic shock and  concern for necrotizing fasciitis of the left flank. PMH significant for IV drug abuse, hepatitis C and obesity.   ASSESSMENT / PLAN:  PULMONARY A: Need for mechanical ventilation / failure to extubate after surgery P:   Wean ventilatory support as mental status improves Continue PRVC while paralytics on board Rapid SBT to extubation as able  CARDIOVASCULAR A:  Septic shock with some component of medication induced hypotension P:  S/p IVF Norepinephrine support as needed for MAP >65 TTE in AM Treat underlying infection  RENAL A:   Acute renal failure Cr 2.36 from 0.98 1 y ago P:   Avoid nephrotoxins Maintain MAP >65 Renally dose antibiotics Monitor and correct lytes as needed Follow UOP  GASTROINTESTINAL A:   No acute issues Hx Hepatitis C P:   Stress ulcer ppx while intubated  HEMATOLOGIC A:   Anemia - Hgb 10.6, chronicity unclear P:  Monitor Check Coags  INFECTIOUS A:   Septic shock secondary to SSTI left flank with concern for necrotizing fasciitis P:   Continue vancomycin, zosyn Add clinda 900 q8h given reported concern for necrotizing fasciitis Follow cultures Repeat debridements per surgery Check HIV  ENDOCRINE A:   No acute issues  P:   Monitor  NEUROLOGIC A:   No acute issues P:   Monitor RASS Goal 0 to -1  FAMILY  - Updates: no family available  - Inter-disciplinary family meet or Palliative Care meeting due by:  day 7  The patient is critically ill with multiple organ system failure and requires high complexity decision making for assessment and support, frequent evaluation and titration of therapies, advanced monitoring, review of radiographic studies and interpretation of complex data.   Critical Care Time devoted to patient care services, exclusive of separately billable procedures, described in this note is 40 minutes.   Nita Sickle, MD Pulmonary and Critical Care Medicine [facility] Pager: [phone]  12/26/2015, 10:39 PM

## 2015-12-26 NOTE — H&P (Signed)
Kathleen Frederick is an 35 y.o. female.   Chief Complaint: Left flank pain HPI: This is a 35 year old female presents with left flank pain. She has been in the emergency department approximate 4 hours. She is hypotensive. She seems sedated and admits to drug use. She apparently has a several-day history of worsening flank pain. She has received IV antibiotics in the emergency department an IV fluid bolus. She is unable L a white blood count and renal failure and again has been hypotensive.  Past Medical History  Diagnosis Date  . Anxiety   . Hepatitis C     History reviewed. No pertinent past surgical history.  History reviewed. No pertinent family history. Social History:  reports that she has been smoking.  She does not have any smokeless tobacco history on file. She reports that she uses illicit drugs (Cocaine and IV). She reports that she does not drink alcohol.  Allergies: No Known Allergies   (Not in a hospital admission)  Results for orders placed or performed during the hospital encounter of 12/26/15 (from the past 48 hour(s))  Comprehensive metabolic panel     Status: Abnormal   Collection Time: 12/26/15  4:38 PM  Result Value Ref Range   Sodium 135 135 - 145 mmol/L   Potassium 3.5 3.5 - 5.1 mmol/L   Chloride 98 (L) 101 - 111 mmol/L   CO2 22 22 - 32 mmol/L   Glucose, Bld 112 (H) 65 - 99 mg/dL   BUN 26 (H) 6 - 20 mg/dL   Creatinine, Ser 2.35 (H) 0.44 - 1.00 mg/dL   Calcium 8.6 (L) 8.9 - 10.3 mg/dL   Total Protein 6.5 6.5 - 8.1 g/dL   Albumin 2.6 (L) 3.5 - 5.0 g/dL   AST 69 (H) 15 - 41 U/L   ALT 42 14 - 54 U/L   Alkaline Phosphatase 93 38 - 126 U/L   Total Bilirubin 1.1 0.3 - 1.2 mg/dL   GFR calc non Af Amer 26 (L) >60 mL/min   GFR calc Af Amer 30 (L) >60 mL/min    Comment: (NOTE) The eGFR has been calculated using the CKD EPI equation. This calculation has not been validated in all clinical situations. eGFR's persistently <60 mL/min signify possible Chronic  Kidney Disease.    Anion gap 15 5 - 15  CBC     Status: Abnormal   Collection Time: 12/26/15  4:38 PM  Result Value Ref Range   WBC 19.9 (H) 4.0 - 10.5 K/uL   RBC 3.87 3.87 - 5.11 MIL/uL   Hemoglobin 10.6 (L) 12.0 - 15.0 g/dL   HCT 31.6 (L) 36.0 - 46.0 %   MCV 81.7 78.0 - 100.0 fL   MCH 27.4 26.0 - 34.0 pg   MCHC 33.5 30.0 - 36.0 g/dL   RDW 13.5 11.5 - 15.5 %   Platelets 245 150 - 400 K/uL  I-Stat beta hCG blood, ED ([facility], [facility], [facility] only)     Status: None   Collection Time: 12/26/15  4:45 PM  Result Value Ref Range   I-stat hCG, quantitative <5.0 <5 mIU/mL   Comment 3            Comment:   GEST. AGE      CONC.  (mIU/mL)   <=1 WEEK        5 - 50     2 WEEKS       50 - 500     3 WEEKS  100 - 10,000     4 WEEKS     1,000 - 30,000        FEMALE AND NON-PREGNANT FEMALE:     LESS THAN 5 mIU/mL   I-Stat CG4 Lactic Acid, ED     Status: None   Collection Time: 12/26/15  4:47 PM  Result Value Ref Range   Lactic Acid, Venous 1.64 0.5 - 2.0 mmol/L   No results found.  Review of Systems  Unable to perform ROS: medical condition    Blood pressure 83/57, pulse 90, temperature 97.4 F (36.3 C), temperature source Oral, resp. rate 13, SpO2 91 %. Physical Exam  Constitutional: She appears distressed.  Morbidly obese  HENT:  Head: Normocephalic and atraumatic.  Right Ear: External ear normal.  Left Ear: External ear normal.  Eyes: Conjunctivae are normal. Pupils are equal, round, and reactive to light. No scleral icterus.  Cardiovascular: Regular rhythm and normal heart sounds.   Tachycardic  Respiratory: Effort normal and breath sounds normal. No respiratory distress.  GI: There is tenderness.  Large area of cellulitis of the left flank with an upper left flank abscess with induration and tenderness  Musculoskeletal: Normal range of motion. She exhibits no edema or tenderness.  Neurological:  Somnolent but will follow commands and tries to answer questions  Skin: Skin is warm.  There is erythema.  Multiple skin lesions on her lower extremities     Assessment/Plan Left flank abscess with sepsis  She may have a necrotizing infection. She is in renal failure and has an elevated white blood count. Emergent incision and drainage of the abscess with possible debridement is recommended. I tried to discuss the risks again she is fairly somnolent which may be from her recent drug use or the sepsis. Surgery again is scheduled emergently.  Harl Bowie, MD 12/26/2015, 8:45 PM

## 2015-12-26 NOTE — Transfer of Care (Signed)
Immediate Anesthesia Transfer of Care Note  Patient: Kathleen Frederick  Procedure(s) Performed: Procedure(s): INCISION AND DRAINAGE ABSCESS (Left)  Patient Location: ICU  Anesthesia Type:General  Level of Consciousness: Patient remains intubated per anesthesia plan  Airway & Oxygen Therapy: Patient remains intubated per anesthesia plan and Patient placed on Ventilator (see vital sign flow sheet for setting)  Post-op Assessment: Report given to RN and Post -op Vital signs reviewed and stable  Post vital signs: Reviewed and stable  Last Vitals:  Filed Vitals:   12/26/15 1800 12/26/15 2000  BP: 88/65 83/57  Pulse: 100 90  Temp:    Resp:  13    Complications: No apparent anesthesia complications

## 2015-12-26 NOTE — Anesthesia Procedure Notes (Addendum)
Central Venous Catheter Insertion Performed by: anesthesiologist Patient location: Pre-op. Preanesthetic checklist: patient identified, IV checked, site marked, risks and benefits discussed, surgical consent, monitors and equipment checked, pre-op evaluation, timeout performed and anesthesia consent Position: Trendelenburg Landmarks identified and Seldinger technique used Catheter size: 8 Fr Central line was placed.Double lumen Procedure performed using ultrasound guided technique. Attempts: 1 Following insertion, dressing applied, line sutured and Biopatch. Post procedure assessment: blood return through all ports, free fluid flow and no air. Patient tolerated the procedure well with no immediate complications.   Procedure Name: Intubation Performed by: Val EagleMOSER, Kelvyn Schunk Pre-anesthesia Checklist: Patient identified, Timeout performed, Emergency Drugs available, Suction available and Patient being monitored Patient Re-evaluated:Patient Re-evaluated prior to inductionOxygen Delivery Method: Circle system utilized Preoxygenation: Pre-oxygenation with 100% oxygen Intubation Type: IV induction and Rapid sequence Laryngoscope Size: Mac and 3 Grade View: Grade I Tube type: Oral Tube size: 7.5 mm Number of attempts: 1 Airway Equipment and Method: Stylet Placement Confirmation: ETT inserted through vocal cords under direct vision,  positive ETCO2 and breath sounds checked- equal and bilateral Secured at: 21 cm Tube secured with: Tape Dental Injury: Teeth and Oropharynx as per pre-operative assessment

## 2015-12-26 NOTE — Progress Notes (Signed)
eLink Physician-Brief Progress Note Patient Name: Kathleen Frederick DOB: 27-May-1981 MRN: [id number]   Date of Service  12/26/2015  HPI/Events of Note  On vent.  Need SUP.  eICU Interventions  Ordered protonix     Intervention Category Major Interventions: Other:  Rodricus Candelaria 12/26/2015, 11:51 PM

## 2015-12-26 NOTE — Op Note (Signed)
INCISION AND DRAINAGE ABSCESS  Procedure Note  Kathleen Frederick 12/26/2015   Pre-op Diagnosis: Left flank Abscess     Post-op Diagnosis: same  Procedure(s): INCISION, DRAINAGE, AND DEBRIDEMENT OF LEFT FLANK ABSCESS  Surgeon(s): Abigail Miyamotoouglas Jewelene Mairena, MD  Anesthesia: General  Staff:  Circulator: Iantha FallenMelissa H Arrington, RN Scrub Person: Lenox PondsAshley N Grant Circulator Assistant: Jola SchmidtVincent P Dimattia, RN Float Surgical Tech: Ranee GosselinMCKNIGHT, BRYAN  Estimated Blood Loss: less than 50 mL               Specimens: cultures sent  Findings:  Large left flank abscess with necrotic fat.  Did not seem to involve the fascia or underlying muscle.  160 square cm of skin and fat sharply debrided with Frederick scaple.          Kathleen Frederick   Date: 12/26/2015  Time: 10:36 PM

## 2015-12-27 ENCOUNTER — Inpatient Hospital Stay (HOSPITAL_COMMUNITY): Payer: Self-pay

## 2015-12-27 ENCOUNTER — Encounter (HOSPITAL_COMMUNITY): Payer: Self-pay | Admitting: General Practice

## 2015-12-27 DIAGNOSIS — L02211 Cutaneous abscess of abdominal wall: Secondary | ICD-10-CM

## 2015-12-27 DIAGNOSIS — R6521 Severe sepsis with septic shock: Secondary | ICD-10-CM

## 2015-12-27 DIAGNOSIS — A419 Sepsis, unspecified organism: Principal | ICD-10-CM

## 2015-12-27 LAB — BASIC METABOLIC PANEL
Anion gap: 13 (ref 5–15)
Anion gap: 10 (ref 5–15)
BUN: 20 mg/dL (ref 6–20)
BUN: 14 mg/dL (ref 6–20)
CALCIUM: 6.8 mg/dL — AB (ref 8.9–10.3)
CO2: 22 mmol/L (ref 22–32)
CO2: 21 mmol/L — ABNORMAL LOW (ref 22–32)
CREATININE: 1.51 mg/dL — AB (ref 0.44–1.00)
Calcium: 7.1 mg/dL — ABNORMAL LOW (ref 8.9–10.3)
Chloride: 105 mmol/L (ref 101–111)
Chloride: 110 mmol/L (ref 101–111)
Creatinine, Ser: 1.11 mg/dL — ABNORMAL HIGH (ref 0.44–1.00)
GFR calc non Af Amer: 45 mL/min — ABNORMAL LOW (ref >60)
GFR, EST AFRICAN AMERICAN: 52 mL/min — AB (ref >60)
Glucose, Bld: 95 mg/dL (ref 65–99)
Glucose, Bld: 80 mg/dL (ref 65–99)
POTASSIUM: 3.6 mmol/L (ref 3.5–5.1)
Potassium: 3.3 mmol/L — ABNORMAL LOW (ref 3.5–5.1)
SODIUM: 140 mmol/L (ref 135–145)
SODIUM: 141 mmol/L (ref 135–145)

## 2015-12-27 LAB — CBC
HCT: 24.4 % — ABNORMAL LOW (ref 36.0–46.0)
HEMATOCRIT: 24.9 % — AB (ref 36.0–46.0)
Hemoglobin: 8.3 g/dL — ABNORMAL LOW (ref 12.0–15.0)
Hemoglobin: 8 g/dL — ABNORMAL LOW (ref 12.0–15.0)
MCH: 27.3 pg (ref 26.0–34.0)
MCH: 27.2 pg (ref 26.0–34.0)
MCHC: 33.3 g/dL (ref 30.0–36.0)
MCHC: 32.8 g/dL (ref 30.0–36.0)
MCV: 81.9 fL (ref 78.0–100.0)
MCV: 83 fL (ref 78.0–100.0)
PLATELETS: 216 10*3/uL (ref 150–400)
Platelets: 217 10*3/uL (ref 150–400)
RBC: 3.04 MIL/uL — ABNORMAL LOW (ref 3.87–5.11)
RBC: 2.94 MIL/uL — AB (ref 3.87–5.11)
RDW: 13.8 % (ref 11.5–15.5)
RDW: 14.1 % (ref 11.5–15.5)
WBC: 12.6 10*3/uL — ABNORMAL HIGH (ref 4.0–10.5)
WBC: 9.4 10*3/uL (ref 4.0–10.5)

## 2015-12-27 LAB — PROTIME-INR
INR: 1.18 (ref 0.00–1.49)
Prothrombin Time: 15.1 seconds (ref 11.6–15.2)

## 2015-12-27 LAB — BRAIN NATRIURETIC PEPTIDE: B NATRIURETIC PEPTIDE 5: 46.8 pg/mL (ref 0.0–100.0)

## 2015-12-27 LAB — GLUCOSE, CAPILLARY: Glucose-Capillary: 76 mg/dL (ref 65–99)

## 2015-12-27 LAB — APTT: aPTT: 33 seconds (ref 24–37)

## 2015-12-27 LAB — HIV ANTIBODY (ROUTINE TESTING W REFLEX): HIV SCREEN 4TH GENERATION: NONREACTIVE

## 2015-12-27 LAB — MRSA PCR SCREENING: MRSA by PCR: POSITIVE — AB

## 2015-12-27 LAB — TROPONIN I: TROPONIN I: 0.08 ng/mL — AB (ref <0.031)

## 2015-12-27 LAB — ABO/RH: ABO/RH(D): A POS

## 2015-12-27 LAB — FIBRINOGEN: FIBRINOGEN: 605 mg/dL — AB (ref 204–475)

## 2015-12-27 LAB — MAGNESIUM: MAGNESIUM: 1.6 mg/dL — AB (ref 1.7–2.4)

## 2015-12-27 LAB — PHOSPHORUS: Phosphorus: 4.1 mg/dL (ref 2.5–4.6)

## 2015-12-27 MED ORDER — FENTANYL BOLUS VIA INFUSION
50.0000 ug | INTRAVENOUS | Status: DC | PRN
  Administered 2015-12-28 – 2015-12-29 (×6): 50 ug via INTRAVENOUS
  Filled 2015-12-27: qty 50

## 2015-12-27 MED ORDER — CHLORHEXIDINE GLUCONATE 0.12 % MT SOLN
OROMUCOSAL | Status: AC
  Filled 2015-12-27: qty 15

## 2015-12-27 MED ORDER — MIDAZOLAM HCL 2 MG/2ML IJ SOLN
2.0000 mg | INTRAMUSCULAR | Status: DC | PRN
  Administered 2015-12-28 – 2015-12-29 (×4): 2 mg via INTRAVENOUS
  Filled 2015-12-27 (×4): qty 2

## 2015-12-27 MED ORDER — SODIUM CHLORIDE 0.9 % IV SOLN
25.0000 ug/h | INTRAVENOUS | Status: DC
  Administered 2015-12-27 – 2015-12-29 (×7): 250 ug/h via INTRAVENOUS
  Administered 2015-12-29: 300 ug/h via INTRAVENOUS
  Administered 2015-12-30: 400 ug/h via INTRAVENOUS
  Filled 2015-12-27 (×8): qty 50

## 2015-12-27 MED ORDER — NOREPINEPHRINE BITARTRATE 1 MG/ML IV SOLN
2.0000 ug/min | INTRAVENOUS | Status: DC
  Administered 2015-12-27: 5 ug/min via INTRAVENOUS
  Administered 2015-12-28: 1 ug/min via INTRAVENOUS
  Filled 2015-12-27 (×3): qty 4

## 2015-12-27 MED ORDER — LACTATED RINGERS IV SOLN
INTRAVENOUS | Status: DC
  Administered 2015-12-27: 20:00:00 via INTRAVENOUS
  Administered 2015-12-27: 125 mL/h via INTRAVENOUS

## 2015-12-27 MED ORDER — POTASSIUM CHLORIDE 10 MEQ/50ML IV SOLN
10.0000 meq | INTRAVENOUS | Status: AC
  Administered 2015-12-27 (×4): 10 meq via INTRAVENOUS
  Filled 2015-12-27 (×4): qty 50

## 2015-12-27 NOTE — ED Provider Notes (Signed)
CSN: [id number]     Arrival date & time 12/26/15  1616 History   First MD Initiated Contact with Patient 12/26/15 1644     Chief Complaint  Patient presents with  . Abscess     (Consider location/radiation/quality/duration/timing/severity/associated sxs/prior Treatment) Patient is a 35 y.o. female presenting with abscess. The history is provided by the patient.  Abscess Location:  Torso Torso abscess location:  L flank Size:  Approximately 10cm x 5cm Abscess quality: draining, fluctuance, induration, painful, redness and warmth   Red streaking: yes   Duration:  1 week Progression:  Worsening Pain details:    Quality:  Sharp and throbbing   Severity:  Severe   Duration:  1 week   Timing:  Constant   Progression:  Worsening Chronicity:  Recurrent Context: injected drug use   Context: not diabetes   Relieved by:  Nothing Worsened by:  Draining/squeezing Ineffective treatments:  Draining/squeezing Associated symptoms: fatigue and fever   Associated symptoms: no headaches, no nausea and no vomiting   Fever:    Duration:  1 week   Timing:  Intermittent   Temp source:  Subjective   Progression:  Waxing and waning Risk factors: hx of MRSA and prior abscess     Past Medical History  Diagnosis Date  . Anxiety   . Hepatitis C    History reviewed. No pertinent past surgical history. History reviewed. No pertinent family history. Social History  Substance Use Topics  . Smoking status: Current Every Day Smoker  . Smokeless tobacco: None  . Alcohol Use: No   OB History    No data available     Review of Systems  Constitutional: Positive for fever, chills, activity change and fatigue.  HENT: Negative for congestion, rhinorrhea and sinus pressure.   Respiratory: Negative for cough, chest tightness and shortness of breath.   Cardiovascular: Positive for chest pain and leg swelling. Negative for palpitations.  Gastrointestinal: Positive for abdominal pain. Negative for  nausea and vomiting.  Genitourinary: Positive for flank pain. Negative for dysuria, urgency, frequency and difficulty urinating.  Musculoskeletal: Negative for myalgias and back pain.  Skin: Positive for rash and wound (draining abscess on left flank).  Neurological: Negative for dizziness, syncope and headaches.  All other systems reviewed and are negative.     Allergies  Review of patient's allergies indicates no known allergies.  Home Medications   Prior to Admission medications   Not on File   BP 95/62 mmHg  Pulse 79  Temp(Src) 95.7 F (35.4 C) (Axillary)  Resp 18  Ht 5\' 6"  (1.676 m)  Wt 120 kg  BMI 42.72 kg/m2  SpO2 100% Physical Exam  Constitutional: She is oriented to person, place, and time. She appears well-developed and well-nourished. She appears lethargic. She is cooperative. She is easily aroused. She appears ill. She appears distressed.  HENT:  Head: Normocephalic and atraumatic.  Nose: Nose normal.  Mouth/Throat: Oropharynx is clear and moist.  Eyes: Conjunctivae and EOM are normal. Pupils are equal, round, and reactive to light.  Neck: Normal range of motion. Neck supple.  Cardiovascular: Regular rhythm, normal heart sounds and intact distal pulses.  Tachycardia present.   Pulmonary/Chest: Effort normal and breath sounds normal. She exhibits no tenderness.  Abdominal: Soft. There is tenderness.  Musculoskeletal: She exhibits edema (1+ pitting edema in b/l LE. ).  Neurological: She is oriented to person, place, and time and easily aroused. She appears lethargic. No cranial nerve deficit. Coordination normal.  Skin: Skin is warm.  Ecchymosis and rash noted. Rash is pustular. She is not diaphoretic. There is erythema.     Large abscess on left flank approximately 10cm by 5cm with significant surrounding erythema, induration, with purulent drainage noted. No significant sub-q emphysema noted.    Nursing note and vitals reviewed.   ED Course  Procedures  (including critical care time) Labs Review Labs Reviewed  COMPREHENSIVE METABOLIC PANEL - Abnormal; Notable for the following:    Chloride 98 (*)    Glucose, Bld 112 (*)    BUN 26 (*)    Creatinine, Ser 2.35 (*)    Calcium 8.6 (*)    Albumin 2.6 (*)    AST 69 (*)    GFR calc non Af Amer 26 (*)    GFR calc Af Amer 30 (*)    All other components within normal limits  CBC - Abnormal; Notable for the following:    WBC 19.9 (*)    Hemoglobin 10.6 (*)    HCT 31.6 (*)    All other components within normal limits  URINALYSIS, ROUTINE W REFLEX MICROSCOPIC (NOT AT [facility]) - Abnormal; Notable for the following:    APPearance HAZY (*)    All other components within normal limits  TROPONIN I - Abnormal; Notable for the following:    Troponin I 0.08 (*)    All other components within normal limits  POCT I-STAT 3, ART BLOOD GAS (G3+) - Abnormal; Notable for the following:    pH, Arterial 7.295 (*)    pO2, Arterial 349.0 (*)    Acid-base deficit 7.0 (*)    All other components within normal limits  CULTURE, BLOOD (ROUTINE X 2)  CULTURE, BLOOD (ROUTINE X 2)  URINE CULTURE  MRSA PCR SCREENING  ANAEROBIC CULTURE  GRAM STAIN  CULTURE, ROUTINE-ABSCESS  CULTURE, ROUTINE-ABSCESS  ANAEROBIC CULTURE  BRAIN NATRIURETIC PEPTIDE  GLUCOSE, CAPILLARY  CBC  BASIC METABOLIC PANEL  FIBRINOGEN  PROTIME-INR  APTT  HIV ANTIBODY (ROUTINE TESTING)  BLOOD GAS, ARTERIAL  I-STAT BETA HCG BLOOD, ED ([facility], [facility], [facility] ONLY)  I-STAT CG4 LACTIC ACID, ED  I-STAT CG4 LACTIC ACID, ED  I-STAT CG4 LACTIC ACID, ED  TYPE AND SCREEN  ABO/RH    Imaging Review Dg Chest Port 1 View  12/26/2015  CLINICAL DATA:  Central line placement EXAM: PORTABLE CHEST 1 VIEW COMPARISON:  None. FINDINGS: Endotracheal tube extends into the right mainstem bronchus and should be pulled back 2-3 cm. The nasogastric tube extends well into the stomach. There is a right jugular central line which extends into the low SVC. There is no  pneumothorax. The lungs are grossly clear. No large effusion. IMPRESSION: Right mainstem intubation. Recommend pulling the ET tube back 2-3 cm. Satisfactory NG tube position. Critical Value/emergent results were called by telephone at the time of interpretation on 12/26/2015 at 11:13 pm to Dr. Andria Meuse, who verbally acknowledged these results. Electronically Signed   By: Ellery Plunk M.D.   On: 12/26/2015 23:16   I have personally reviewed and evaluated these images and lab results as part of my medical decision-making.   EKG Interpretation   Date/Time:  Sunday Dalton 09 2017 18:23:02 EDT Ventricular Rate:  103 PR Interval:  139 QRS Duration: 87 QT Interval:  379 QTC Calculation: 496 R Axis:   18 Text Interpretation:  Sinus tachycardia Low voltage, precordial leads  Prolonged QT interval No previous tracing Confirmed by Anitra Lauth  MD,  WHITNEY ([id number]) on 12/26/2015 6:31:47 PM      MDM  34  y.o. female with a history of IVDU, and CHF, presents to the emergency department for evaluation of a left flank abscess which is been progressively worsening over the last week and has been associated with subjective fever,chills and significant sharp pain. She reports a history of recent contact with her significant other who also struggles with polysubstance abuse and uses methamphetamine and has skin picking behaviors with recent multiple skin abscesses. She states that yesterday abscess began to drain slightly and she tried to drain it further however was unable to completely drain it. She states that over the course of this week she has relapsed into her IV drug abuse but states that her abscess started before this happened.  physical exam as above. On arrival the patient was found to be septic with tachycardia and hypotension but found to be afebrile.  Code sepsis labs were ordered along with IV fluids and empiric broad spectrum abx. Her labs were significant for a leukocytosis of 19.9 hemoglobin 10.6, AKI  with a creatinine of 2.35 with BUN of 26. Lactic acid was 1.64. Troponin mildly detectable at 0.08, like from demand, BNP normal. EKG shows sinus tachycardia with no acute ST or t-wave abnormalities suggestive of ischemia. Surgery was consulted and after evaluating her at the bedside planned to take her directly to the OR for surgical care. Just before she was transferred she was found to have gradually decreasing blood pressure and worsening somnolence. More IV fluids were ordered as her MAPs remained just above 65, but were trending down. She was then transferred to the OR for surgical management and admitted to the surgical service.    Final diagnoses:  Cellulitis and abscess of trunk        Francoise Ceo, DO 12/27/15 8469  Gwyneth Sprout, MD 12/29/15 2139

## 2015-12-27 NOTE — Anesthesia Postprocedure Evaluation (Signed)
Anesthesia Post Note  Patient: Kathleen Frederick  Procedure(s) Performed: Procedure(s) (LRB): INCISION AND DRAINAGE ABSCESS (Left)  Patient location during evaluation: ICU Anesthesia Type: General Level of consciousness: sedated Pain management: pain level controlled Vital Signs Assessment: post-procedure vital signs reviewed and stable Respiratory status: patient remains intubated per anesthesia plan Cardiovascular status: stable Postop Assessment: no signs of nausea or vomiting Anesthetic complications: no    Last Vitals:  Filed Vitals:   12/27/15 0630 12/27/15 0645  BP: 95/54 105/60  Pulse: 85 88  Temp:    Resp: 23 17    Last Pain:  Filed Vitals:   12/27/15 0655  PainSc: Asleep                 Cathi Hazan

## 2015-12-27 NOTE — Clinical Documentation Improvement (Signed)
Orthopedic  Please further clarify procedure documentation in the medical record of "debridement."  Thank you   Type - excisional, non-excisional  Other condition  Clinically Undetermined   Supporting Information: :   Op Note "I made a large transverse incision across the flank with a scalpel. I entered a large abscess cavity. Purulence was identified and cultured. The patient's overlying skin was necrotic and small area, but there was a large amount of necrotic fat underneath this. I had sharply debrided 160 sq cm of skin and underlying fat going down to the fascia with a scalpel."   Please exercise your independent, professional judgment when responding. A specific answer is not anticipated or expected.   Thank You, Lavonda JumboLawanda J Latoy Labriola [employer] [facility] [place] [phone]

## 2015-12-27 NOTE — Progress Notes (Addendum)
Multiple patient belongings were itemized and documented.  Valuables, such as money, wallet, silver toned jewelry, cell phone and charger where taken downstairs to security.  All items were verified by myself and the charge nurse Chrissie NoaWilliam. Multiple medications were also found in the patient's belongings.  These items were itemized and sent to the pharmacy for verification and storage.  Specific items found in the patient belongings were taken by security and removed from the patient's room.  The security team which removed these items included Charla and Darby.

## 2015-12-27 NOTE — Progress Notes (Signed)
PULMONARY / CRITICAL CARE MEDICINE   Name: Kathleen Frederick MRN: [id number] DOB: 03/06/1981    ADMISSION DATE:  12/26/2015 CONSULTATION DATE:  12/26/15  REFERRING MD:  Barrie Dunker  CHIEF COMPLAINT:  Severe sepsis, left flank abscess  HISTORY OF PRESENT ILLNESS:   Ms. Kathleen Frederick is a 61F who presented to the ED with complaints of left flank pain. She was found to be hypotensive. She has a history of iv drug abuse, hepatitis C and anxiety. Initial labs showed acute renal failure with Cr 2.36 and leukocytosis at 19.9. Blood cultures were drawn. She was given IVF and antibiotics, then taken to the OR urgently for debridement out of concern for necrotizing fasciitis. She was intubated and central line and a-line were placed. Intraoperatively she required intermittent pressor support. The surgeon noted necrotic fat, but did not feel that the underlying fascia or muscle were involved. Postoperatively she was admitted to 2S. She remains intubated.   She is unable to provide any additional history as she is currently intubated, sedated and paralyzed.   PAST MEDICAL HISTORY :  She  has a past medical history of Anxiety and Hepatitis C.  PAST SURGICAL HISTORY: She  has no past surgical history on file.  No Known Allergies  No current facility-administered medications on file prior to encounter.   No current outpatient prescriptions on file prior to encounter.    FAMILY HISTORY:  Her has no family status information on file.   SOCIAL HISTORY: She  reports that she has been smoking.  She does not have any smokeless tobacco history on file. She reports that she uses illicit drugs (Cocaine and IV). She reports that she does not drink alcohol.  REVIEW OF SYSTEMS:   Unable to obtain as pt is sedated, intubated.  VITAL SIGNS: BP 109/58 mmHg  Pulse 90  Temp(Src) 98.4 F (36.9 C) (Axillary)  Resp 17  Ht  (1.676 m)  Wt 264 lb 8.8 oz (120 kg)  BMI 42.72 kg/m2  SpO2 98%  HEMODYNAMICS:    VENTILATOR SETTINGS: Vent Mode:  [-] PRVC FiO2 (%):  [40 %-100 %] 40 % Set Rate:  [16 bmp] 16 bmp Vt Set:  [470 mL] 470 mL PEEP:  [5 cmH20] 5 cmH20 Plateau Pressure:  [11 cmH20-16 cmH20] 11 cmH20  INTAKE / OUTPUT: I/O last 3 completed shifts: In: 3732.1 [I.V.:3632.1; IV Piggyback:100] Out: 2080 [Urine:1980; Blood:100]  PHYSICAL EXAMINATION: Gen: Sedated, unresponsive, No distress HEENT: PERRL, no thyromegaly, JVD Neuro: No gross focal deficits CVS: RRR, no MRG RS: Clear, no wheze or crackles. Abd: lt flank dressing clean, dry. + BS, non tender, non distended Muskuloskeletal: Normal tone and bulk, no edema Skin- Intact.   LABS:  BMET  Recent Labs Lab 12/26/15 1638 12/27/15 0428  NA 135 140  K 3.5 3.3*  CL 98* 105  CO2 22 22  BUN 26* 20  CREATININE 2.35* 1.51*  GLUCOSE 112* 95    Electrolytes  Recent Labs Lab 12/26/15 1638 12/27/15 0428  CALCIUM 8.6* 6.8*    CBC  Recent Labs Lab 12/26/15 1638 12/27/15 0428  WBC 19.9* 12.6*  HGB 10.6* 8.3*  HCT 31.6* 24.9*  PLT 245 217    Coag's  Recent Labs Lab 12/27/15 0032  APTT 33  INR 1.18    Sepsis Markers  Recent Labs Lab 12/26/15 1647 12/26/15 2128  LATICACIDVEN 1.64 0.67    ABG  Recent Labs Lab 12/26/15 2320  PHART 7.295*  PCO2ART 40.8  PO2ART 349.0*    Liver  Enzymes  Recent Labs Lab 12/26/15 1638  AST 69*  ALT 42  ALKPHOS 93  BILITOT 1.1  ALBUMIN 2.6*    Cardiac Enzymes  Recent Labs Lab 12/26/15 2350  TROPONINI 0.08*    Glucose  Recent Labs Lab 12/26/15 2303  GLUCAP 76    Imaging Dg Chest Port 1 View  12/26/2015  CLINICAL DATA:  Central line placement EXAM: PORTABLE CHEST 1 VIEW COMPARISON:  None. FINDINGS: Endotracheal tube extends into the right mainstem bronchus and should be pulled back 2-3 cm. The nasogastric tube extends well into the stomach. There is a right jugular central line which extends into the low SVC. There is no pneumothorax. The lungs are  grossly clear. No large effusion. IMPRESSION: Right mainstem intubation. Recommend pulling the ET tube back 2-3 cm. Satisfactory NG tube position. Critical Value/emergent results were called by telephone at the time of interpretation on 12/26/2015 at 11:13 pm to Dr. Andria MeuseStevens, who verbally acknowledged these results. Electronically Signed   By: Ellery Plunkaniel R Mitchell M.D.   On: 12/26/2015 23:16     STUDIES:    CULTURES: Blood culture 12/26/15 Wound culture 12/26/15  ANTIBIOTICS: Vanc 4/9 >> Zosyn 4/9 >> Clinidamycin 4/9 >>  SIGNIFICANT EVENTS: I&D left flank abscess  LINES/TUBES: A-line 4/9 CVC 4/9 ETT 4/9 Foley 4/9  DISCUSSION: 22F w/ severe sepsis / septic shock and concern for necrotizing fasciitis of the left flank. PMH significant for IV drug abuse, hepatitis C and obesity.   ASSESSMENT / PLAN:  PULMONARY A: Need for mechanical ventilation / failure to extubate after surgery P:   Full vent support. No plan for extubation today as she may go back to OR. Wake up and wean tomorrow  CARDIOVASCULAR A:  Septic shock with some component of medication induced hypotension P:  Norepinephrine support as needed for MAP >65. Wean pressors as tolerated. Follow TTE  RENAL A:   Acute renal failure Cr 2.36 (baseline 0.98)  P:   Avoid nephrotoxins Maintain MAP >65 Renally dose antibiotics Monitor and correct lytes as needed Follow UOP and Cr  GASTROINTESTINAL A:   No acute issues Hx Hepatitis C P:   Protonix for stress ulcer prophylaxis. Start tube feeds tomorrow if still intubated.   HEMATOLOGIC A:   Anemia - Hgb 10.6 > 8.3, chronicity unclear No source of bleeding P:  Monitor. Transfuse for Hb < 7  INFECTIOUS A:   Septic shock secondary to left flank infection No evidence of necrotizing fasciitis P:   Continue vancomycin, zosyn, clinidamycin Follow cultures Repeat debridements per surgery Follow HIV  ENDOCRINE A:   No acute issues  P:    Monitor  NEUROLOGIC A:   No acute issues P:   Monitor RASS Goal 0 to -1  FAMILY  - Updates: no family available - Inter-disciplinary family meet or Palliative Care meeting due by:  day 7.  Critical care time- 35 mins  Chilton GreathousePraveen Daveena Elmore MD [facility] Pager [phone] If no answer or after 3pm call: [phone] 12/27/2015, 10:10 AM

## 2015-12-27 NOTE — Progress Notes (Signed)
1 Day Post-Op  Subjective: Remains on pressor.   Objective: Vital signs in last 24 hours: Temp:  [95.7 F (35.4 C)-98.4 F (36.9 C)] 98.4 F (36.9 C) (04/10 0757) Pulse Rate:  [74-130] 90 (04/10 0900) Resp:  [0-23] 17 (04/10 0900) BP: (83-137)/(51-97) 109/58 mmHg (04/10 0900) SpO2:  [91 %-100 %] 98 % (04/10 0900) Arterial Line BP: (85-144)/(30-79) 110/57 mmHg (04/10 0900) FiO2 (%):  [40 %-100 %] 40 % (04/10 0800) Weight:  [120 kg (264 lb 8.8 oz)] 120 kg (264 lb 8.8 oz) (04/09 2340)    Intake/Output from previous day: 04/09 0701 - 04/10 0700 In: 3732.1 [I.V.:3632.1; IV Piggyback:100] Out: 2080 [Urine:1980; Blood:100] Intake/Output this shift: Total I/O In: -  Out: 325 [Urine:325]  Awake, alert, intubated L flank - still with extensive cellulitis which is very tender. Cellulitis mainly inferior and medial to open wound (20 x 15 cm); open wound - mainly looks good. No real addl necrotic tissue.   Lab Results:   Recent Labs  12/26/15 1638 12/27/15 0428  WBC 19.9* 12.6*  HGB 10.6* 8.3*  HCT 31.6* 24.9*  PLT 245 217   BMET  Recent Labs  12/26/15 1638 12/27/15 0428  NA 135 140  K 3.5 3.3*  CL 98* 105  CO2 22 22  GLUCOSE 112* 95  BUN 26* 20  CREATININE 2.35* 1.51*  CALCIUM 8.6* 6.8*   PT/INR  Recent Labs  12/27/15 0032  LABPROT 15.1  INR 1.18   ABG  Recent Labs  12/26/15 2320  PHART 7.295*  HCO3 20.6    Studies/Results: Dg Chest Port 1 View  12/27/2015  CLINICAL DATA:  Acute respiratory failure. EXAM: PORTABLE CHEST 1 VIEW COMPARISON:  12/26/2015 FINDINGS: Endotracheal tube is 2 cm above the carina. Right central line tip in the upper right atrium. Very low lung volumes with bibasilar atelectasis and accentuation of heart size. NG tube is in the stomach. No visible significant effusions. IMPRESSION: Very low lung volumes with bibasilar atelectasis. Electronically Signed   By: Charlett NoseKevin  Dover M.D.   On: 12/27/2015 10:30   Dg Chest Port 1  View  12/26/2015  CLINICAL DATA:  Central line placement EXAM: PORTABLE CHEST 1 VIEW COMPARISON:  None. FINDINGS: Endotracheal tube extends into the right mainstem bronchus and should be pulled back 2-3 cm. The nasogastric tube extends well into the stomach. There is a right jugular central line which extends into the low SVC. There is no pneumothorax. The lungs are grossly clear. No large effusion. IMPRESSION: Right mainstem intubation. Recommend pulling the ET tube back 2-3 cm. Satisfactory NG tube position. Critical Value/emergent results were called by telephone at the time of interpretation on 12/26/2015 at 11:13 pm to Dr. Andria MeuseStevens, who verbally acknowledged these results. Electronically Signed   By: Ellery Plunkaniel R Mitchell M.D.   On: 12/26/2015 23:16    Anti-infectives: Anti-infectives    Start     Dose/Rate Route Frequency Ordered Stop   12/27/15 0800  vancomycin (VANCOCIN) 1,250 mg in sodium chloride 0.9 % 250 mL IVPB     1,250 mg 166.7 mL/hr over 90 Minutes Intravenous Every 24 hours 12/26/15 2352     12/27/15 0000  clindamycin (CLEOCIN) IVPB 900 mg     900 mg 100 mL/hr over 30 Minutes Intravenous Every 8 hours 12/26/15 2324     12/27/15 0000  piperacillin-tazobactam (ZOSYN) IVPB 3.375 g     3.375 g 12.5 mL/hr over 240 Minutes Intravenous Every 8 hours 12/26/15 2352     12/26/15 1815  vancomycin (VANCOCIN) IVPB 1000 mg/200 mL premix     1,000 mg 200 mL/hr over 60 Minutes Intravenous  Once 12/26/15 1806 12/26/15 2037   12/26/15 1815  piperacillin-tazobactam (ZOSYN) IVPB 3.375 g     3.375 g 100 mL/hr over 30 Minutes Intravenous  Once 12/26/15 1806 12/26/15 1938      Assessment/Plan: s/p Procedure(s): INCISION AND DRAINAGE ABSCESS (Left)  Right now wound looks ok - no addl necrotic tissue. Still with extensive cellulitis. Had to fentanyl bolus for dressing change and pt still al little uncomfortable. Dressing changes will be challenging for pain control. Should get better as  cellulitis improves.   Cont IV abx Wean to extubate, wean pressor May need some mild sedation for dressing change tomorrow Not ready for wound vac given ongoing cellulitis. If cellulitis doesn't improve may need addl debridement Discussed with CCM  Mary Sella. Andrey Campanile, MD, FACS General, Bariatric, & Minimally Invasive Surgery [facility], [place]   LOS: 1 day    Atilano Ina 12/27/2015

## 2015-12-28 ENCOUNTER — Ambulatory Visit (HOSPITAL_COMMUNITY): Payer: Self-pay

## 2015-12-28 ENCOUNTER — Encounter (HOSPITAL_COMMUNITY): Payer: Self-pay | Admitting: Surgery

## 2015-12-28 DIAGNOSIS — A419 Sepsis, unspecified organism: Secondary | ICD-10-CM

## 2015-12-28 LAB — BASIC METABOLIC PANEL
ANION GAP: 12 (ref 5–15)
BUN: 9 mg/dL (ref 6–20)
CO2: 23 mmol/L (ref 22–32)
Calcium: 7.3 mg/dL — ABNORMAL LOW (ref 8.9–10.3)
Chloride: 106 mmol/L (ref 101–111)
Creatinine, Ser: 1.09 mg/dL — ABNORMAL HIGH (ref 0.44–1.00)
Glucose, Bld: 79 mg/dL (ref 65–99)
POTASSIUM: 3.5 mmol/L (ref 3.5–5.1)
SODIUM: 141 mmol/L (ref 135–145)

## 2015-12-28 LAB — GLUCOSE, CAPILLARY
GLUCOSE-CAPILLARY: 61 mg/dL — AB (ref 65–99)
GLUCOSE-CAPILLARY: 96 mg/dL (ref 65–99)
Glucose-Capillary: 103 mg/dL — ABNORMAL HIGH (ref 65–99)
Glucose-Capillary: 66 mg/dL (ref 65–99)
Glucose-Capillary: 84 mg/dL (ref 65–99)

## 2015-12-28 LAB — CBC
HCT: 24 % — ABNORMAL LOW (ref 36.0–46.0)
Hemoglobin: 7.8 g/dL — ABNORMAL LOW (ref 12.0–15.0)
MCH: 27.1 pg (ref 26.0–34.0)
MCHC: 32.5 g/dL (ref 30.0–36.0)
MCV: 83.3 fL (ref 78.0–100.0)
PLATELETS: 194 10*3/uL (ref 150–400)
RBC: 2.88 MIL/uL — AB (ref 3.87–5.11)
RDW: 14.4 % (ref 11.5–15.5)
WBC: 6.6 10*3/uL (ref 4.0–10.5)

## 2015-12-28 LAB — URINE CULTURE: Culture: NO GROWTH

## 2015-12-28 LAB — ECHOCARDIOGRAM COMPLETE
Height: 66 in
Weight: 4232.83 oz

## 2015-12-28 MED ORDER — VANCOMYCIN HCL IN DEXTROSE 1-5 GM/200ML-% IV SOLN
1000.0000 mg | Freq: Three times a day (TID) | INTRAVENOUS | Status: DC
  Administered 2015-12-28 – 2015-12-30 (×6): 1000 mg via INTRAVENOUS
  Filled 2015-12-28 (×8): qty 200

## 2015-12-28 MED ORDER — DEXTROSE 50 % IV SOLN
25.0000 mL | Freq: Once | INTRAVENOUS | Status: AC
  Administered 2015-12-28: 25 mL via INTRAVENOUS

## 2015-12-28 MED ORDER — VITAL HIGH PROTEIN PO LIQD: 1000.0000 mL | ORAL | Status: DC

## 2015-12-28 MED ORDER — VITAL HIGH PROTEIN PO LIQD
1000.0000 mL | ORAL | Status: DC
  Administered 2015-12-28 – 2015-12-29 (×3): 1000 mL

## 2015-12-28 MED ORDER — POTASSIUM CHLORIDE 10 MEQ/50ML IV SOLN
10.0000 meq | INTRAVENOUS | Status: AC
Start: 2015-12-28 — End: 2015-12-28
  Administered 2015-12-28 (×2): 10 meq via INTRAVENOUS
  Filled 2015-12-28: qty 50

## 2015-12-28 MED ORDER — CHLORHEXIDINE GLUCONATE CLOTH 2 % EX PADS
6.0000 | MEDICATED_PAD | Freq: Every day | CUTANEOUS | Status: DC
  Administered 2015-12-28 – 2016-01-01 (×4): 6 via TOPICAL

## 2015-12-28 MED ORDER — DEXTROSE 50 % IV SOLN
INTRAVENOUS | Status: AC
  Filled 2015-12-28: qty 50

## 2015-12-28 MED ORDER — DEXTROSE-NACL 5-0.9 % IV SOLN
INTRAVENOUS | Status: DC
  Administered 2015-12-28: 17:00:00 via INTRAVENOUS
  Administered 2015-12-29: 125 mL via INTRAVENOUS
  Administered 2015-12-29 – 2015-12-30 (×2): via INTRAVENOUS
  Administered 2015-12-30: 125 mL/h via INTRAVENOUS

## 2015-12-28 MED ORDER — MUPIROCIN 2 % EX OINT
1.0000 "application " | TOPICAL_OINTMENT | Freq: Two times a day (BID) | CUTANEOUS | Status: AC
  Administered 2015-12-28 – 2016-01-02 (×10): 1 via NASAL
  Filled 2015-12-28 (×3): qty 22

## 2015-12-28 MED ORDER — DEXTROSE 50 % IV SOLN
INTRAVENOUS | Status: AC
  Administered 2015-12-28: 25 mL via INTRAVENOUS
  Filled 2015-12-28: qty 50

## 2015-12-28 NOTE — Progress Notes (Signed)
PULMONARY / CRITICAL CARE MEDICINE   Name: Kathleen Frederick MRN: [id number] DOB: [birth year]    ADMISSION DATE:  12/26/2015 CONSULTATION DATE:  12/26/15  REFERRING MD:  Barrie Dunker  CHIEF COMPLAINT:  Severe sepsis, left flank abscess  HISTORY OF PRESENT ILLNESS:   Kathleen Frederick is a 11F who presented to the ED with complaints of left flank pain. She was found to be hypotensive. She has a history of iv drug abuse, hepatitis C and anxiety. Initial labs showed acute renal failure with Cr 2.36 and leukocytosis at 19.9. Blood cultures were drawn. She was given IVF and antibiotics, then taken to the OR urgently for debridement out of concern for necrotizing fasciitis. She was intubated and central line and a-line were placed. Intraoperatively she required intermittent pressor support. The surgeon noted necrotic fat, but did not feel that the underlying fascia or muscle were involved. Postoperatively she was admitted to 2S. She remains intubated.   She is unable to provide any additional history as she is currently intubated, sedated and paralyzed.   PAST MEDICAL HISTORY :  She  has a past medical history of Anxiety and Hepatitis C.  PAST SURGICAL HISTORY: She  has no past surgical history on file.  No Known Allergies  No current facility-administered medications on file prior to encounter.   No current outpatient prescriptions on file prior to encounter.    FAMILY HISTORY:  Her has no family status information on file.   SOCIAL HISTORY: She  reports that she has been smoking.  She does not have any smokeless tobacco history on file. She reports that she uses illicit drugs (Cocaine and IV). She reports that she does not drink alcohol.  REVIEW OF SYSTEMS:   Unable to obtain as pt is sedated, intubated.  VITAL SIGNS: BP 92/59 mmHg  Pulse 68  Temp(Src) 97.7 F (36.5 C) (Axillary)  Resp 14  Ht  (1.676 m)  Wt 264 lb 8.8 oz (120 kg)  BMI 42.72 kg/m2  SpO2 100%  HEMODYNAMICS:    VENTILATOR SETTINGS: Vent Mode:  [-] PRVC FiO2 (%):  [40 %] 40 % Set Rate:  [16 bmp] 16 bmp Vt Set:  [470 mL] 470 mL PEEP:  [5 cmH20] 5 cmH20 Pressure Support:  [5 cmH20] 5 cmH20 Plateau Pressure:  [8 cmH20-17 cmH20] 14 cmH20  INTAKE / OUTPUT: I/O last 3 completed shifts: In: 8588.9 [I.V.:7688.9; IV Piggyback:900] Out: 4135 [Urine:4035; Blood:100]  PHYSICAL EXAMINATION: Gen: Sedated, unresponsive, No distress HEENT: PERRL, no thyromegaly, JVD Neuro: No gross focal deficits CVS: RRR, no MRG RS: Clear, no wheze or crackles. Abd: lt flank dressing clean, dry. + BS, non tender, non distended Muskuloskeletal: Normal tone and bulk, no edema Skin- Intact.   LABS:  BMET  Recent Labs Lab 12/27/15 0428 12/27/15 1821 12/28/15 0440  NA 140 141 141  K 3.3* 3.6 3.5  CL 105 110 106  CO2 22 21* 23  BUN CREATININE 1.51* 1.11* 1.09*  GLUCOSE 95 80 79    Electrolytes  Recent Labs Lab 12/27/15 0428 12/27/15 1821 12/28/15 0440  CALCIUM 6.8* 7.1* 7.3*  MG  --  1.6*  --   PHOS  --  4.1  --     CBC  Recent Labs Lab 12/27/15 0428 12/27/15 1821 12/28/15 0440  WBC 12.6* 9.4 6.6  HGB 8.3* 8.0* 7.8*  HCT 24.9* 24.4* 24.0*  PLT 217 216 194    Coag's  Recent Labs Lab 12/27/15 0032  APTT 33  INR 1.18    Sepsis Markers  Recent Labs Lab 12/26/15 1647 12/26/15 2128  LATICACIDVEN 1.64 0.67    ABG  Recent Labs Lab 12/26/15 2320  PHART 7.295*  PCO2ART 40.8  PO2ART 349.0*    Liver Enzymes  Recent Labs Lab 12/26/15 1638  AST 69*  ALT 42  ALKPHOS 93  BILITOT 1.1  ALBUMIN 2.6*    Cardiac Enzymes  Recent Labs Lab 12/26/15 2350  TROPONINI 0.08*    Glucose  Recent Labs Lab 12/26/15 2303  GLUCAP 76    Imaging Dg Chest Port 1 View  12/27/2015  CLINICAL DATA:  Acute respiratory failure. EXAM: PORTABLE CHEST 1 VIEW COMPARISON:  12/26/2015 FINDINGS: Endotracheal tube is 2 cm above the carina. Right central line tip in the upper  right atrium. Very low lung volumes with bibasilar atelectasis and accentuation of heart size. NG tube is in the stomach. No visible significant effusions. IMPRESSION: Very low lung volumes with bibasilar atelectasis. Electronically Signed   By: Kathleen NoseKevin  Frederick M.D.   On: 12/27/2015 10:30     STUDIES:    CULTURES: Blood culture 12/26/15 Wound culture 12/26/15  ANTIBIOTICS: Vanc 4/9 >> Zosyn 4/9 >> Clinidamycin 4/9 >>  SIGNIFICANT EVENTS: I&D left flank abscess  LINES/TUBES: A-line 4/9 CVC 4/9 ETT 4/9 Foley 4/9  DISCUSSION: 39F w/ severe sepsis / septic shock and concern for necrotizing fasciitis of the left flank. PMH significant for IV drug abuse, hepatitis C and obesity.   ASSESSMENT / PLAN:  PULMONARY A: Need for mechanical ventilation / failure to extubate after surgery P:   Full vent support. Wake up and wean today  CARDIOVASCULAR A:  Septic shock with some component of medication induced hypotension P:  Norepinephrine support as needed for MAP >65. Wean pressors as tolerated.  RENAL A:   Acute renal failure Cr 2.36 (baseline 0.98)  P:   Avoid nephrotoxins Maintain MAP >65 Renally dose antibiotics Monitor and correct lytes as needed Follow UOP and Cr   GASTROINTESTINAL A:   No acute issues Hx Hepatitis C P:   Protonix for stress ulcer prophylaxis. Start tube feeds  HEMATOLOGIC A:   Anemia - Hgb 10.6 > 8.3 > 7.8, chronicity unclear No source of bleeding P:  Monitor. Transfuse for Hb < 7  INFECTIOUS A:   Septic shock secondary to left flank infection No evidence of necrotizing fasciitis P:   Continue vancomycin, zosyn. D/C clindamycin. Follow HIV  ENDOCRINE A:   No acute issues  P:   Monitor  NEUROLOGIC A:   No acute issues P:   Monitor RASS Goal 0 to -1  FAMILY  - Updates: no family available - Inter-disciplinary family meet or Palliative Care meeting due by:  day 7.  Critical care time- 35 mins  Kathleen GreathousePraveen Hymen Arnett MD [place]  Pulmonary and Critical Care Pager [phone] If no answer or after 3pm call: [phone] 12/28/2015, 9:07 AM

## 2015-12-28 NOTE — Progress Notes (Signed)
Patient placed back on full support due to decreased effort.

## 2015-12-28 NOTE — Progress Notes (Signed)
Patient ID: Kathleen Frederick, female   DOB: Feb 20, 1981, 35 y.o.   MRN: [id number]     [facility]      [address] [name], [name] [address]    Phone: [phone] FAX: [phone]     Subjective: Afebrile. Normal white count. bp low, requiring levo.  Good uop.   Objective:  Vital signs:  Filed Vitals:   12/28/15 0700 12/28/15 0800 12/28/15 0900 12/28/15 1000  BP: 92/59 92/59 90/55  86/54  Pulse: 65 68 68 65  Temp:  97.7 F (36.5 C)    TempSrc:  Axillary    Resp: 18 14 18 11   Height:      Weight:      SpO2: 100% 100% 100% 100%       Intake/Output   Yesterday:  04/10 0701 - 04/11 0700 In: 4856.8 [I.V.:4056.8; IV Piggyback:800] Out: 2055 [Urine:2055] This shift:  Total I/O In: 891.4 [I.V.:461.4; NG/GT:30; IV Piggyback:400] Out: 260 [Urine:260]   Physical Exam: General: Pt awake on the vent.  Skin: left flank-wound is clean, surrounding erythema, marked.    Problem List:   Active Problems:   Septic shock (HCC)   Abscess of flank    Results:   Labs: Results for orders placed or performed during the hospital encounter of 12/26/15 (from the past 48 hour(s))  Blood Culture (routine x 2)     Status: None (Preliminary result)   Collection Time: 12/26/15 12:50 PM  Result Value Ref Range   Specimen Description BLOOD RIGHT ANTECUBITAL    Special Requests IN PEDIATRIC BOTTLE 4CC    Culture NO GROWTH < 24 HOURS    Report Status PENDING   Comprehensive metabolic panel     Status: Abnormal   Collection Time: 12/26/15  4:38 PM  Result Value Ref Range   Sodium 135 135 - 145 mmol/L   Potassium 3.5 3.5 - 5.1 mmol/L   Chloride 98 (L) 101 - 111 mmol/L   CO2 22 22 - 32 mmol/L   Glucose, Bld 112 (H) 65 - 99 mg/dL   BUN 26 (H) 6 - 20 mg/dL   Creatinine, Ser 2.35 (H) 0.44 - 1.00 mg/dL   Calcium 8.6 (L) 8.9 - 10.3 mg/dL   Total Protein 6.5 6.5 - 8.1 g/dL   Albumin 2.6 (L) 3.5 - 5.0 g/dL   AST 69 (H) 15 - 41 U/L   ALT 42  14 - 54 U/L   Alkaline Phosphatase 93 38 - 126 U/L   Total Bilirubin 1.1 0.3 - 1.2 mg/dL   GFR calc non Af Amer 26 (L) >60 mL/min   GFR calc Af Amer 30 (L) >60 mL/min    Comment: (NOTE) The eGFR has been calculated using the CKD EPI equation. This calculation has not been validated in all clinical situations. eGFR's persistently <60 mL/min signify possible Chronic Kidney Disease.    Anion gap 15 5 - 15  CBC     Status: Abnormal   Collection Time: 12/26/15  4:38 PM  Result Value Ref Range   WBC 19.9 (H) 4.0 - 10.5 K/uL   RBC 3.87 3.87 - 5.11 MIL/uL   Hemoglobin 10.6 (L) 12.0 - 15.0 g/dL   HCT 31.6 (L) 36.0 - 46.0 %   MCV 81.7 78.0 - 100.0 fL   MCH 27.4 26.0 - 34.0 pg   MCHC 33.5 30.0 - 36.0 g/dL   RDW 13.5 11.5 - 15.5 %   Platelets 245 150 - 400 K/uL  I-Stat  beta hCG blood, ED ([facility], [facility], [facility] only)     Status: None   Collection Time: 12/26/15  4:45 PM  Result Value Ref Range   I-stat hCG, quantitative <5.0 <5 mIU/mL   Comment 3            Comment:   GEST. AGE      CONC.  (mIU/mL)   <=1 WEEK        5 - 50     2 WEEKS       50 - 500     3 WEEKS       100 - 10,000     4 WEEKS     1,000 - 30,000        FEMALE AND NON-PREGNANT FEMALE:     LESS THAN 5 mIU/mL   I-Stat CG4 Lactic Acid, ED     Status: None   Collection Time: 12/26/15  4:47 PM  Result Value Ref Range   Lactic Acid, Venous 1.64 0.5 - 2.0 mmol/L  Blood Culture (routine x 2)     Status: None (Preliminary result)   Collection Time: 12/26/15  5:55 PM  Result Value Ref Range   Specimen Description BLOOD RIGHT FOREARM    Special Requests BOTTLES DRAWN AEROBIC AND ANAEROBIC 5CC    Culture NO GROWTH < 24 HOURS    Report Status PENDING   Urinalysis, Routine w reflex microscopic (not at [facility])     Status: Abnormal   Collection Time: 12/26/15  8:45 PM  Result Value Ref Range   Color, Urine YELLOW YELLOW   APPearance HAZY (A) CLEAR   Specific Gravity, Urine 1.007 1.005 - 1.030   pH 5.0 5.0 - 8.0   Glucose, UA NEGATIVE  NEGATIVE mg/dL   Hgb urine dipstick NEGATIVE NEGATIVE   Bilirubin Urine NEGATIVE NEGATIVE   Ketones, ur NEGATIVE NEGATIVE mg/dL   Protein, ur NEGATIVE NEGATIVE mg/dL   Nitrite NEGATIVE NEGATIVE   Leukocytes, UA NEGATIVE NEGATIVE    Comment: MICROSCOPIC NOT DONE ON URINES WITH NEGATIVE PROTEIN, BLOOD, LEUKOCYTES, NITRITE, OR GLUCOSE <1000 mg/dL.  Urine culture     Status: None   Collection Time: 12/26/15  8:45 PM  Result Value Ref Range   Specimen Description URINE, CATHETERIZED    Special Requests NONE    Culture NO GROWTH 2 DAYS    Report Status 12/28/2015 FINAL   I-Stat CG4 Lactic Acid, ED  (not at  [facility])     Status: None   Collection Time: 12/26/15  9:28 PM  Result Value Ref Range   Lactic Acid, Venous 0.67 0.5 - 2.0 mmol/L  Type and screen [place]     Status: None   Collection Time: 12/26/15 10:15 PM  Result Value Ref Range   ABO/RH(D) A POS    Antibody Screen NEG    Sample Expiration 12/29/2015   ABO/Rh     Status: None   Collection Time: 12/26/15 10:15 PM  Result Value Ref Range   ABO/RH(D) A POS   Glucose, capillary     Status: None   Collection Time: 12/26/15 11:03 PM  Result Value Ref Range   Glucose-Capillary 76 65 - 99 mg/dL   Comment 1 Notify RN    Comment 2 Document in Chart   MRSA PCR Screening     Status: Abnormal   Collection Time: 12/26/15 11:16 PM  Result Value Ref Range   MRSA by PCR POSITIVE (A) NEGATIVE    Comment:  The GeneXpert MRSA Assay (FDA approved for NASAL specimens only), is one component of a comprehensive MRSA colonization surveillance program. It is not intended to diagnose MRSA infection nor to guide or monitor treatment for MRSA infections. RESULT CALLED TO, READ BACK BY AND VERIFIED WITH: PARRIN,J RN [phone] AT 0150 SKEEN,P   I-STAT 3, arterial blood gas (G3+)     Status: Abnormal   Collection Time: 12/26/15 11:20 PM  Result Value Ref Range   pH, Arterial 7.295 (L) 7.350 - 7.450   pCO2 arterial 40.8  35.0 - 45.0 mmHg   pO2, Arterial 349.0 (H) 80.0 - 100.0 mmHg   Bicarbonate 20.6 20.0 - 24.0 mEq/L   TCO2 22 0 - 100 mmol/L   O2 Saturation 100.0 %   Acid-base deficit 7.0 (H) 0.0 - 2.0 mmol/L   Patient temperature 93.4 F    Collection site RADIAL, ALLEN'S TEST ACCEPTABLE    Sample type ARTERIAL   Culture, routine-abscess     Status: None (Preliminary result)   Collection Time: 12/26/15 11:20 PM  Result Value Ref Range   Specimen Description ABSCESS    Special Requests LEFT FLANK PATIENT ON FOLLOWING VANCOMYCIN ZOSYN    Gram Stain      ABUNDANT WBC PRESENT,BOTH PMN AND MONONUCLEAR NO SQUAMOUS EPITHELIAL CELLS SEEN FEW GRAM POSITIVE COCCI IN PAIRS IN CLUSTERS Performed at [facility]    Culture      ABUNDANT STAPHYLOCOCCUS AUREUS Note: RIFAMPIN AND GENTAMICIN SHOULD NOT BE USED AS SINGLE DRUGS FOR TREATMENT OF STAPH INFECTIONS. Performed at [facility]    Report Status PENDING   Anaerobic culture     Status: None (Preliminary result)   Collection Time: 12/26/15 11:20 PM  Result Value Ref Range   Specimen Description ABSCESS    Special Requests LEFT FLANK PATIENT ON FOLLOWING VANCOMYCIN ZOSYN    Gram Stain      ABUNDANT WBC PRESENT,BOTH PMN AND MONONUCLEAR NO SQUAMOUS EPITHELIAL CELLS SEEN FEW GRAM POSITIVE COCCI IN PAIRS IN CLUSTERS Performed at [facility]    Culture PENDING    Report Status PENDING   Troponin I     Status: Abnormal   Collection Time: 12/26/15 11:50 PM  Result Value Ref Range   Troponin I 0.08 (H) <0.031 ng/mL    Comment:        PERSISTENTLY INCREASED TROPONIN VALUES IN THE RANGE OF 0.04-0.49 ng/mL CAN BE SEEN IN:       -UNSTABLE ANGINA       -CONGESTIVE HEART FAILURE       -MYOCARDITIS       -CHEST TRAUMA       -ARRYHTHMIAS       -LATE PRESENTING MYOCARDIAL INFARCTION       -COPD   CLINICAL FOLLOW-UP RECOMMENDED.   Brain natriuretic peptide     Status: None   Collection Time: 12/26/15 11:51 PM  Result Value Ref  Range   B Natriuretic Peptide 46.8 0.0 - 100.0 pg/mL  Fibrinogen (coagulopathy lab panel)     Status: Abnormal   Collection Time: 12/27/15 12:32 AM  Result Value Ref Range   Fibrinogen 605 (H) 204 - 475 mg/dL  Protime-INR (coagulopathy lab panel)     Status: None   Collection Time: 12/27/15 12:32 AM  Result Value Ref Range   Prothrombin Time 15.1 11.6 - 15.2 seconds   INR 1.18 0.00 - 1.49  APTT (coagulopathy lab panel)     Status: None   Collection Time: 12/27/15 12:32 AM  Result Value Ref Range   aPTT 33 24 - 37 seconds  HIV antibody     Status: None   Collection Time: 12/27/15 12:32 AM  Result Value Ref Range   HIV Screen 4th Generation wRfx Non Reactive Non Reactive    Comment: (NOTE) Performed At: [facility] [place], [place] [id number] Lindon Romp MD [id number]   CBC     Status: Abnormal   Collection Time: 12/27/15  4:28 AM  Result Value Ref Range   WBC 12.6 (H) 4.0 - 10.5 K/uL    Comment: REPEATED TO VERIFY   RBC 3.04 (L) 3.87 - 5.11 MIL/uL   Hemoglobin 8.3 (L) 12.0 - 15.0 g/dL    Comment: SPECIMEN CHECKED FOR CLOTS REPEATED TO VERIFY    HCT 24.9 (L) 36.0 - 46.0 %   MCV 81.9 78.0 - 100.0 fL   MCH 27.3 26.0 - 34.0 pg   MCHC 33.3 30.0 - 36.0 g/dL   RDW 13.8 11.5 - 15.5 %   Platelets 217 150 - 400 K/uL    Comment: REPEATED TO VERIFY  Basic metabolic panel     Status: Abnormal   Collection Time: 12/27/15  4:28 AM  Result Value Ref Range   Sodium 140 135 - 145 mmol/L   Potassium 3.3 (L) 3.5 - 5.1 mmol/L   Chloride 105 101 - 111 mmol/L   CO2 22 22 - 32 mmol/L   Glucose, Bld 95 65 - 99 mg/dL   BUN 20 6 - 20 mg/dL   Creatinine, Ser 1.51 (H) 0.44 - 1.00 mg/dL   Calcium 6.8 (L) 8.9 - 10.3 mg/dL   GFR calc non Af Amer 45 (L) >60 mL/min   GFR calc Af Amer 52 (L) >60 mL/min    Comment: (NOTE) The eGFR has been calculated using the CKD EPI equation. This calculation has not been validated in all clinical situations. eGFR's persistently  <60 mL/min signify possible Chronic Kidney Disease.    Anion gap 13 5 - 15  CBC     Status: Abnormal   Collection Time: 12/27/15  6:21 PM  Result Value Ref Range   WBC 9.4 4.0 - 10.5 K/uL   RBC 2.94 (L) 3.87 - 5.11 MIL/uL   Hemoglobin 8.0 (L) 12.0 - 15.0 g/dL   HCT 24.4 (L) 36.0 - 46.0 %   MCV 83.0 78.0 - 100.0 fL   MCH 27.2 26.0 - 34.0 pg   MCHC 32.8 30.0 - 36.0 g/dL   RDW 14.1 11.5 - 15.5 %   Platelets 216 150 - 400 K/uL  Basic metabolic panel     Status: Abnormal   Collection Time: 12/27/15  6:21 PM  Result Value Ref Range   Sodium 141 135 - 145 mmol/L   Potassium 3.6 3.5 - 5.1 mmol/L   Chloride 110 101 - 111 mmol/L   CO2 21 (L) 22 - 32 mmol/L   Glucose, Bld 80 65 - 99 mg/dL   BUN 14 6 - 20 mg/dL   Creatinine, Ser 1.11 (H) 0.44 - 1.00 mg/dL   Calcium 7.1 (L) 8.9 - 10.3 mg/dL   GFR calc non Af Amer >60 >60 mL/min   GFR calc Af Amer >60 >60 mL/min    Comment: (NOTE) The eGFR has been calculated using the CKD EPI equation. This calculation has not been validated in all clinical situations. eGFR's persistently <60 mL/min signify possible Chronic Kidney Disease.    Anion gap 10 5 - 15  Magnesium  Status: Abnormal   Collection Time: 12/27/15  6:21 PM  Result Value Ref Range   Magnesium 1.6 (L) 1.7 - 2.4 mg/dL  Phosphorus     Status: None   Collection Time: 12/27/15  6:21 PM  Result Value Ref Range   Phosphorus 4.1 2.5 - 4.6 mg/dL  CBC     Status: Abnormal   Collection Time: 12/28/15  4:40 AM  Result Value Ref Range   WBC 6.6 4.0 - 10.5 K/uL   RBC 2.88 (L) 3.87 - 5.11 MIL/uL   Hemoglobin 7.8 (L) 12.0 - 15.0 g/dL   HCT 24.0 (L) 36.0 - 46.0 %   MCV 83.3 78.0 - 100.0 fL   MCH 27.1 26.0 - 34.0 pg   MCHC 32.5 30.0 - 36.0 g/dL   RDW 14.4 11.5 - 15.5 %   Platelets 194 150 - 400 K/uL  Basic metabolic panel     Status: Abnormal   Collection Time: 12/28/15  4:40 AM  Result Value Ref Range   Sodium 141 135 - 145 mmol/L   Potassium 3.5 3.5 - 5.1 mmol/L   Chloride 106  101 - 111 mmol/L   CO2 23 22 - 32 mmol/L   Glucose, Bld 79 65 - 99 mg/dL   BUN 9 6 - 20 mg/dL   Creatinine, Ser 1.09 (H) 0.44 - 1.00 mg/dL   Calcium 7.3 (L) 8.9 - 10.3 mg/dL   GFR calc non Af Amer >60 >60 mL/min   GFR calc Af Amer >60 >60 mL/min    Comment: (NOTE) The eGFR has been calculated using the CKD EPI equation. This calculation has not been validated in all clinical situations. eGFR's persistently <60 mL/min signify possible Chronic Kidney Disease.    Anion gap 12 5 - 15    Imaging / Studies: Dg Chest Port 1 View  12/27/2015  CLINICAL DATA:  Acute respiratory failure. EXAM: PORTABLE CHEST 1 VIEW COMPARISON:  12/26/2015 FINDINGS: Endotracheal tube is 2 cm above the carina. Right central line tip in the upper right atrium. Very low lung volumes with bibasilar atelectasis and accentuation of heart size. NG tube is in the stomach. No visible significant effusions. IMPRESSION: Very low lung volumes with bibasilar atelectasis. Electronically Signed   By: Rolm Baptise M.D.   On: 12/27/2015 10:30   Dg Chest Port 1 View  12/26/2015  CLINICAL DATA:  Central line placement EXAM: PORTABLE CHEST 1 VIEW COMPARISON:  None. FINDINGS: Endotracheal tube extends into the right mainstem bronchus and should be pulled back 2-3 cm. The nasogastric tube extends well into the stomach. There is a right jugular central line which extends into the low SVC. There is no pneumothorax. The lungs are grossly clear. No large effusion. IMPRESSION: Right mainstem intubation. Recommend pulling the ET tube back 2-3 cm. Satisfactory NG tube position. Critical Value/emergent results were called by telephone at the time of interpretation on 12/26/2015 at 11:13 pm to Dr. Gerilyn Nestle, who verbally acknowledged these results. Electronically Signed   By: Andreas Newport M.D.   On: 12/26/2015 23:16    Medications / Allergies:  Scheduled Meds: . antiseptic oral rinse  7 mL Mouth Rinse QID  . chlorhexidine gluconate (SAGE KIT)  15  mL Mouth Rinse BID  . enoxaparin (LOVENOX) injection  40 mg Subcutaneous Q24H  . pantoprazole (PROTONIX) IV  40 mg Intravenous Q24H  . piperacillin-tazobactam (ZOSYN)  IV  3.375 g Intravenous Q8H  . vancomycin  1,250 mg Intravenous Q24H   Continuous Infusions: . fentaNYL infusion INTRAVENOUS  300 mcg/hr (12/28/15 1100)  . lactated ringers 125 mL/hr at 12/27/15 1930  . norepinephrine (LEVOPHED) Adult infusion 1 mcg/min (12/28/15 1018)   PRN Meds:.fentaNYL, HYDROmorphone (DILAUDID) injection, midazolam, [DISCONTINUED] ondansetron **OR** ondansetron (ZOFRAN) IV  Antibiotics: Anti-infectives    Start     Dose/Rate Route Frequency Ordered Stop   12/27/15 0800  vancomycin (VANCOCIN) 1,250 mg in sodium chloride 0.9 % 250 mL IVPB     1,250 mg 166.7 mL/hr over 90 Minutes Intravenous Every 24 hours 12/26/15 2352     12/27/15 0000  clindamycin (CLEOCIN) IVPB 900 mg  Status:  Discontinued     900 mg 100 mL/hr over 30 Minutes Intravenous Every 8 hours 12/26/15 2324 12/28/15 0909   12/27/15 0000  piperacillin-tazobactam (ZOSYN) IVPB 3.375 g     3.375 g 12.5 mL/hr over 240 Minutes Intravenous Every 8 hours 12/26/15 2352     12/26/15 1815  vancomycin (VANCOCIN) IVPB 1000 mg/200 mL premix     1,000 mg 200 mL/hr over 60 Minutes Intravenous  Once 12/26/15 1806 12/26/15 2037   12/26/15 1815  piperacillin-tazobactam (ZOSYN) IVPB 3.375 g     3.375 g 100 mL/hr over 30 Minutes Intravenous  Once 12/26/15 1806 12/26/15 1938        Assessment/Plan Sepsis POD#2 I&D left flank abscess---Dr. Ninfa Linden Cellulitis -wound is clean, will consider placing a wound vac tomorrow. -watch surrounding cellulitis and continue with antibiotics -i suspect we'll have hard time with pain management during dressing changes.  ID-vanc/zosyn, follow cultures resp-may wean to extubate  Hypotension-requiring low dose levo Acute renal failure-improved, sCr 1.09.  Hx hepatitis C Anemia-follow  Appreciate CCM management of  medical problems.    Erby Pian, [facility] Pager [phone]) For consults and floor pages call [phone](7A-4:30P)  12/28/2015 11:10 AM

## 2015-12-28 NOTE — Progress Notes (Signed)
Initial Nutrition Assessment  DOCUMENTATION CODES:   Morbid obesity  INTERVENTION:   Initiate Vital High Protein formula at 20 ml/hr and increase by 10 ml every 4 hours to goal rate of 70 ml/hr  Above TF regimen to provide 1680 kcals, 147 gm protein, 1404 ml of free water  NUTRITION DIAGNOSIS:   Inadequate oral intake related to inability to eat as evidenced by NPO status  GOAL:   Provide needs based on ASPEN/SCCM guidelines  MONITOR:   TF tolerance, Vent status, Labs, Weight trends, I & O's  REASON FOR ASSESSMENT:   Consult Enteral/tube feeding initiation and management  ASSESSMENT:   [age] Female who presented to the ED with complaints of left flank pain. She was found to be hypotensive. She has a history of iv drug abuse, hepatitis C and anxiety. Initial labs showed acute renal failure with Cr 2.36 and leukocytosis at 19.9. Blood cultures were drawn. She was given IVF and antibiotics, then taken to the OR urgently for debridement out of concern for necrotizing fasciitis. She was intubated and central line and a-line were placed. Intraoperatively she required intermittent pressor support. The surgeon noted necrotic fat, but did not feel that the underlying fascia or muscle were involved. Postoperatively she was admitted to 2S. She remains intubated.    Patient s/p procedure 4/9: INCISION, DRAINAGE AND SHARP DEBRIDEMENT OF LEFT FLANK ABSCESS  Patient is currently intubated on ventilator support -- OGT in place Temp (24hrs), Avg:98.5 F (36.9 C), Min:97.7 F (36.5 C), Max:99.2 F (37.3 C)   Nutrition focused physical exam completed.  No muscle or subcutaneous fat depletion noticed.  Diet Order:  Diet NPO time specified  Skin:  Reviewed, no issues  Last BM:  N/A  Height:   Ht Readings from Last 1 Encounters:  12/26/15 5\' 6"  (1.676 m)    Weight:   Wt Readings from Last 1 Encounters:  12/26/15 264 lb 8.8 oz (120 kg)    Ideal Body Weight:  59 kg  BMI:  Body  mass index is 42.72 kg/(m^2).  Estimated Nutritional Needs:   Kcal:  1320-1680  Protein:  145-155 gm  Fluid:  per MD  EDUCATION NEEDS:   No education needs identified at this time  Maureen ChattersKatie Danean Marner, RD, LDN Pager #: [phone] After-Hours Pager #: [phone]

## 2015-12-28 NOTE — Progress Notes (Signed)
  Echocardiogram 2D Echocardiogram has been performed.  Leta JunglingCooper, Faryal Marxen M 12/28/2015, 1:50 PM

## 2015-12-28 NOTE — Progress Notes (Signed)
12/28/2015 1200 Re-attempted weaning from Vent, pt. Weaned approximately 20 min and then placed back on full support due to lack of pt. Effort. Sedation decreased to encourage pt. To be more alert during weaning attempts. Will attempt for a third time later in shift. Will continue to closely monitor patient.  Bertice Risse, Blanchard KelchStephanie Ingold

## 2015-12-28 NOTE — Progress Notes (Signed)
12/28/2015 12:40 PM Notified by Infection prevention nurse of need to place orders for Bactroban nasal and CHG baths per protocol for positive MRSA screen. Orders placed. Pt. Provided education on new orders. Will continue to closely monitor patient.

## 2015-12-28 NOTE — Progress Notes (Signed)
Patient placed back on full vent support due to decreased patient effort.

## 2015-12-28 NOTE — Progress Notes (Signed)
Hypoglycemic Event  CBG: 61  Treatment:  25 ml D 50  Symptoms: NONE  Follow-up CBG: Time: 1355 CBG Result: 96  Possible Reasons for Event: NPO, recently starting tube feedings.   Comments/MD notified:    Lemar Bakos, Blanchard KelchStephanie Ingold

## 2015-12-28 NOTE — Progress Notes (Signed)
Hypoglycemic Event  CBG: 66   Treatment: 25 ml D50  Symptoms: none  Follow-up CBG: Time: 1615 CBG Result: 103  Possible Reasons for Event: NPO status, recently starting tube feeding  Comments/MD notified: E Link MD paged and message left regarding difficulty maintaining glucose levels. Will await further orders.     Kathleen Frederick, Kathleen Frederick

## 2015-12-28 NOTE — Progress Notes (Signed)
[facility] FOR AM LAB REPLACEMENT ONLY  The patient does [facility] based on the criteria listed below:   1. Is GFR >/= 40 ml/min? Yes.    Patient's GFR today is >60 2. Is urine output >/= 0.5 ml/kg/hr for the last 6 hours? Yes.   Patient's UOP is 0.6 ml/kg/hr 3. Is BUN < 60 mg/dL? Yes.    Patient's BUN today is 9 4. Abnormal electrolyte(s): K 3.5 5. Ordered repletion with: per protocol 6. If a panic level lab has been reported, has the CCM MD in charge been notified? No..   Physician:    Markus DaftWHELAN, Landi Biscardi A 12/28/2015 6:05 AM

## 2015-12-28 NOTE — Progress Notes (Signed)
Pt placed back on full vent support due to decreased effort.

## 2015-12-29 LAB — GLUCOSE, CAPILLARY
GLUCOSE-CAPILLARY: 106 mg/dL — AB (ref 65–99)
GLUCOSE-CAPILLARY: 112 mg/dL — AB (ref 65–99)
GLUCOSE-CAPILLARY: 116 mg/dL — AB (ref 65–99)
GLUCOSE-CAPILLARY: 84 mg/dL (ref 65–99)
Glucose-Capillary: 108 mg/dL — ABNORMAL HIGH (ref 65–99)
Glucose-Capillary: 109 mg/dL — ABNORMAL HIGH (ref 65–99)
Glucose-Capillary: 130 mg/dL — ABNORMAL HIGH (ref 65–99)

## 2015-12-29 LAB — BASIC METABOLIC PANEL
ANION GAP: 10 (ref 5–15)
BUN: <5 mg/dL — ABNORMAL LOW (ref 6–20)
CALCIUM: 7.4 mg/dL — AB (ref 8.9–10.3)
CHLORIDE: 105 mmol/L (ref 101–111)
CO2: 25 mmol/L (ref 22–32)
CREATININE: 0.8 mg/dL (ref 0.44–1.00)
GFR calc Af Amer: >60 mL/min (ref >60)
GLUCOSE: 114 mg/dL — AB (ref 65–99)
POTASSIUM: 3.3 mmol/L — AB (ref 3.5–5.1)
Sodium: 140 mmol/L (ref 135–145)

## 2015-12-29 LAB — CBC
HCT: 24.4 % — ABNORMAL LOW (ref 36.0–46.0)
HEMOGLOBIN: 7.9 g/dL — AB (ref 12.0–15.0)
MCH: 27.1 pg (ref 26.0–34.0)
MCHC: 32.4 g/dL (ref 30.0–36.0)
MCV: 83.6 fL (ref 78.0–100.0)
PLATELETS: 204 10*3/uL (ref 150–400)
RBC: 2.92 MIL/uL — AB (ref 3.87–5.11)
RDW: 14.4 % (ref 11.5–15.5)
WBC: 5.4 10*3/uL (ref 4.0–10.5)

## 2015-12-29 LAB — CULTURE, ROUTINE-ABSCESS

## 2015-12-29 MED ORDER — POTASSIUM CHLORIDE 20 MEQ/15ML (10%) PO SOLN
20.0000 meq | ORAL | Status: AC
Start: 2015-12-29 — End: 2015-12-29
  Administered 2015-12-29 (×2): 20 meq
  Filled 2015-12-29 (×2): qty 15

## 2015-12-29 MED ORDER — SODIUM CHLORIDE 0.9 % IV SOLN
3.0000 g | Freq: Three times a day (TID) | INTRAVENOUS | Status: DC
  Administered 2015-12-29 – 2016-01-01 (×9): 3 g via INTRAVENOUS
  Filled 2015-12-29 (×12): qty 3

## 2015-12-29 MED ORDER — GERHARDT'S BUTT CREAM
TOPICAL_CREAM | CUTANEOUS | Status: DC | PRN
  Filled 2015-12-29: qty 1

## 2015-12-29 NOTE — Progress Notes (Signed)
Patient not tolerating wean and put back on full vent support due to decreased patient effort.

## 2015-12-29 NOTE — Progress Notes (Signed)
[facility] LAB REPLACEMENT ONLY  [facility] based on the criteria listed below:   1. Is GFR >/= 40 ml/min? Yes.    Patient's GFR today is >60 2. Is urine output >/= 0.5 ml/kg/hr for the last 6 hours? Yes.   Patient's UOP is 0.6 ml/kg/hr 3. Is BUN < 60 mg/dL? Yes.    Patient's BUN today is <5 4. Abnormal electrolyte(s):k 3.3 5. Ordered repletion with: per protocol 6. If a panic level lab has been reported, has the CCM MD in charge been notified? No..   Physician:    Markus DaftWHELAN, Shera Laubach A 12/29/2015 5:07 AM

## 2015-12-29 NOTE — Plan of Care (Signed)
Problem: Respiratory: Goal: Ability to achieve and maintain a regular respiratory rate will improve Outcome: Not Progressing Remain intubated due to inability to tolerate 3 wean attempts on 12/28/15.

## 2015-12-29 NOTE — Progress Notes (Signed)
Substance Abuse Consult acknowledged. Patient is currently unable to provide any additional history or be assessed as she is currently intubated, sedated and paralyzed. CSW will continue to follow.    Lance MussAshley Gardner,MSW, LCSW [facility] Clinical Social Worker [phone]

## 2015-12-29 NOTE — Progress Notes (Signed)
PULMONARY / CRITICAL CARE MEDICINE   Name: Kathleen Frederick MRN: [id number] DOB: 1980/12/05    ADMISSION DATE:  12/26/2015 CONSULTATION DATE:  12/26/15  REFERRING MD:  Barrie DunkerBlackman, D  CHIEF COMPLAINT:  Severe sepsis, left flank abscess  HISTORY OF PRESENT ILLNESS:   Ms. Kathleen Frederick is a 43F who presented to the ED with complaints of left flank pain. She was found to be hypotensive. She has a history of iv drug abuse, hepatitis C and anxiety. Initial labs showed acute renal failure with Cr 2.36 and leukocytosis at 19.9. Blood cultures were drawn. She was given IVF and antibiotics, then taken to the OR urgently for debridement out of concern for necrotizing fasciitis. She was intubated and central line and a-line were placed. Intraoperatively she required intermittent pressor support. The surgeon noted necrotic fat, but did not feel that the underlying fascia or muscle were involved. Postoperatively she was admitted to 2S. She remains intubated.   She is unable to provide any additional history as she is currently intubated, sedated and paralyzed.   PAST MEDICAL HISTORY :  She  has a past medical history of Anxiety and Hepatitis C.  PAST SURGICAL HISTORY: She  has past surgical history that includes Incision and drainage abscess (Left, 12/26/2015).  No Known Allergies  No current facility-administered medications on file prior to encounter.   No current outpatient prescriptions on file prior to encounter.    FAMILY HISTORY:  Her has no family status information on file.   SOCIAL HISTORY: She  reports that she has been smoking.  She does not have any smokeless tobacco history on file. She reports that she uses illicit drugs (Cocaine and IV). She reports that she does not drink alcohol.  REVIEW OF SYSTEMS:   Unable to obtain as pt is sedated, intubated.  VITAL SIGNS: BP 98/63 mmHg  Pulse 74  Temp(Src) 98.5 F (36.9 C) (Axillary)  Resp 19  Ht 5\' 6"  (1.676 m)  Wt 280 lb 4.8 oz (127.143 kg)   BMI 45.26 kg/m2  SpO2 100%  HEMODYNAMICS:   VENTILATOR SETTINGS: Vent Mode:  [-] PRVC FiO2 (%):  [40 %] 40 % Set Rate:  [16 bmp] 16 bmp Vt Set:  [470 mL] 470 mL PEEP:  [5 cmH20] 5 cmH20 Pressure Support:  [5 cmH20] 5 cmH20 Plateau Pressure:  [11 cmH20-15 cmH20] 11 cmH20  INTAKE / OUTPUT: I/O last 3 completed shifts: In: 7381.1 [I.V.:5411.1; NG/GT:920; IV Piggyback:1050] Out: 2470 [Urine:2470]  PHYSICAL EXAMINATION: Gen: Sedated, Awakens to command, No distress HEENT: PERRL, no thyromegaly, JVD Neuro: No gross focal deficits CVS: RRR, no MRG RS: Clear, no wheze or crackles. Abd: lt flank dressing clean, dry. + BS, non tender, non distended Muskuloskeletal: Normal tone and bulk, no edema Skin- Intact.   LABS:  BMET  Recent Labs Lab 12/27/15 1821 12/28/15 0440 12/29/15 0421  NA 141 141 140  K 3.6 3.5 3.3*  CL 110 106 105  CO2 21* 23 25  BUN 14 9 <5*  CREATININE 1.11* 1.09* 0.80  GLUCOSE 80 79 114*    Electrolytes  Recent Labs Lab 12/27/15 1821 12/28/15 0440 12/29/15 0421  CALCIUM 7.1* 7.3* 7.4*  MG 1.6*  --   --   PHOS 4.1  --   --     CBC  Recent Labs Lab 12/27/15 1821 12/28/15 0440 12/29/15 0421  WBC 9.4 6.6 5.4  HGB 8.0* 7.8* 7.9*  HCT 24.4* 24.0* 24.4*  PLT 216 194 204    Coag's  Recent Labs  Lab 12/27/15 0032  APTT 33  INR 1.18    Sepsis Markers  Recent Labs Lab 12/26/15 1647 12/26/15 2128  LATICACIDVEN 1.64 0.67    ABG  Recent Labs Lab 12/26/15 2320  PHART 7.295*  PCO2ART 40.8  PO2ART 349.0*    Liver Enzymes  Recent Labs Lab 12/26/15 1638  AST 69*  ALT 42  ALKPHOS 93  BILITOT 1.1  ALBUMIN 2.6*    Cardiac Enzymes  Recent Labs Lab 12/26/15 2350  TROPONINI 0.08*    Glucose  Recent Labs Lab 12/28/15 1355 12/28/15 1544 12/28/15 1621 12/28/15 1925 12/29/15 0006 12/29/15 0351  GLUCAP 96 66 103* 84 84 106*    Imaging No results found.   STUDIES:    CULTURES: Blood culture  12/26/15 Wound culture 12/26/15  ANTIBIOTICS: Vanc 4/9 >> Zosyn 4/9 >> Clinidamycin 4/9 >> 4/11  SIGNIFICANT EVENTS: I&D left flank abscess  LINES/TUBES: A-line 4/9 CVC 4/9 ETT 4/9 Foley 4/9  DISCUSSION: 35F w/ severe sepsis / septic shock and concern for necrotizing fasciitis of the left flank. PMH significant for IV drug abuse, hepatitis C and obesity.   ASSESSMENT / PLAN:  PULMONARY A: Need for mechanical ventilation / failure to extubate after surgery P:   Full vent support. Daily wake up and wean assessments Has apneas on weaning as she is having high fentanyl requirements for pain  CARDIOVASCULAR A:  Septic shock with some component of medication induced hypotension P:  Weaned off pressors.  RENAL A:   Acute renal failure Cr 2.36 (baseline 0.98)  P:   Avoid nephrotoxins Maintain MAP >65 Renally dose antibiotics Monitor and correct lytes as needed Follow UOP and Cr   GASTROINTESTINAL A:   No acute issues Hx Hepatitis C P:   Protonix for stress ulcer prophylaxis. Start tube feeds  HEMATOLOGIC A:   Anemia - Hgb 10.6 > 8.3 > 7.8, chronicity unclear No source of bleeding P:  Monitor. Transfuse for Hb < 7  INFECTIOUS A:   Septic shock secondary to left flank infection No evidence of necrotizing fasciitis P:   Continue vancomycin, zosyn.  Follow HIV  ENDOCRINE A:   No acute issues  P:   Monitor  NEUROLOGIC A:   No acute issues P:   Monitor RASS Goal 0 to -1  FAMILY  - Updates: no family available - Inter-disciplinary family meet or Palliative Care meeting due by:  day 7.  Critical care time- 35 mins  Chilton Greathouse MD [place] [facility] Pager [phone] If no answer or after 3pm call: [phone] 12/29/2015, 9:07 AM

## 2015-12-30 LAB — BASIC METABOLIC PANEL
Anion gap: 9 (ref 5–15)
CHLORIDE: 106 mmol/L (ref 101–111)
CO2: 26 mmol/L (ref 22–32)
CREATININE: 0.72 mg/dL (ref 0.44–1.00)
Calcium: 7.7 mg/dL — ABNORMAL LOW (ref 8.9–10.3)
GFR calc Af Amer: >60 mL/min (ref >60)
GFR calc non Af Amer: >60 mL/min (ref >60)
GLUCOSE: 128 mg/dL — AB (ref 65–99)
POTASSIUM: 3.2 mmol/L — AB (ref 3.5–5.1)
SODIUM: 141 mmol/L (ref 135–145)

## 2015-12-30 LAB — GLUCOSE, CAPILLARY
GLUCOSE-CAPILLARY: 123 mg/dL — AB (ref 65–99)
GLUCOSE-CAPILLARY: 107 mg/dL — AB (ref 65–99)
GLUCOSE-CAPILLARY: 100 mg/dL — AB (ref 65–99)
Glucose-Capillary: 115 mg/dL — ABNORMAL HIGH (ref 65–99)
Glucose-Capillary: 121 mg/dL — ABNORMAL HIGH (ref 65–99)
Glucose-Capillary: 92 mg/dL (ref 65–99)

## 2015-12-30 LAB — VANCOMYCIN, TROUGH: VANCOMYCIN TR: 27 ug/mL — AB (ref 10.0–20.0)

## 2015-12-30 LAB — MAGNESIUM: Magnesium: 1.6 mg/dL — ABNORMAL LOW (ref 1.7–2.4)

## 2015-12-30 MED ORDER — POTASSIUM CHLORIDE 20 MEQ/15ML (10%) PO SOLN
20.0000 meq | ORAL | Status: AC
  Administered 2015-12-30: 20 meq
  Filled 2015-12-30 (×2): qty 15

## 2015-12-30 MED ORDER — MAGNESIUM SULFATE 2 GM/50ML IV SOLN
2.0000 g | Freq: Once | INTRAVENOUS | Status: AC
  Administered 2015-12-30: 2 g via INTRAVENOUS
  Filled 2015-12-30: qty 50

## 2015-12-30 MED ORDER — KCL IN DEXTROSE-NACL 40-5-0.9 MEQ/L-%-% IV SOLN
INTRAVENOUS | Status: DC
  Administered 2015-12-30: 100 mL/h via INTRAVENOUS
  Administered 2015-12-30: 10:00:00 via INTRAVENOUS
  Administered 2015-12-31: 50 mL via INTRAVENOUS
  Filled 2015-12-30 (×7): qty 1000

## 2015-12-30 MED ORDER — FENTANYL CITRATE (PF) 100 MCG/2ML IJ SOLN
25.0000 ug | INTRAMUSCULAR | Status: DC | PRN
  Administered 2015-12-30 (×4): 100 ug via INTRAVENOUS
  Administered 2015-12-30: 50 ug via INTRAVENOUS
  Administered 2015-12-30 – 2016-01-01 (×13): 100 ug via INTRAVENOUS
  Filled 2015-12-30 (×19): qty 2

## 2015-12-30 MED ORDER — VANCOMYCIN HCL IN DEXTROSE 1-5 GM/200ML-% IV SOLN
1000.0000 mg | Freq: Two times a day (BID) | INTRAVENOUS | Status: DC
  Administered 2015-12-30 – 2016-01-02 (×5): 1000 mg via INTRAVENOUS
  Filled 2015-12-30 (×8): qty 200

## 2015-12-30 NOTE — Progress Notes (Signed)
PULMONARY / CRITICAL CARE MEDICINE   Name: Kathleen Frederick MRN: [id number] DOB: 1981-09-02    ADMISSION DATE:  12/26/2015 CONSULTATION DATE:  12/26/15  REFERRING MD:  Barrie Dunker  CHIEF COMPLAINT:  Severe sepsis, left flank abscess  HISTORY OF PRESENT ILLNESS:   Kathleen Frederick is a 54F who presented to the ED with complaints of left flank pain. She was found to be hypotensive. She has a history of iv drug abuse, hepatitis C and anxiety. Initial labs showed acute renal failure with Cr 2.36 and leukocytosis at 19.9. Blood cultures were drawn. She was given IVF and antibiotics, then taken to the OR urgently for debridement out of concern for necrotizing fasciitis. She was intubated and central line and a-line were placed. Intraoperatively she required intermittent pressor support. The surgeon noted necrotic fat, but did not feel that the underlying fascia or muscle were involved. Postoperatively she was admitted to 2S. She remains intubated.   She is unable to provide any additional history as she is currently intubated, sedated and paralyzed.   PAST MEDICAL HISTORY :  She  has a past medical history of Anxiety and Hepatitis C.  PAST SURGICAL HISTORY: She  has past surgical history that includes Incision and drainage abscess (Left, 12/26/2015).  No Known Allergies  No current facility-administered medications on file prior to encounter.   No current outpatient prescriptions on file prior to encounter.    FAMILY HISTORY:  Her has no family status information on file.   SOCIAL HISTORY: She  reports that she has been smoking.  She does not have any smokeless tobacco history on file. She reports that she uses illicit drugs (Cocaine and IV). She reports that she does not drink alcohol.  REVIEW OF SYSTEMS:   Unable to obtain as pt is sedated, intubated.  VITAL SIGNS: BP 122/78 mmHg  Pulse 74  Temp(Src) 99.6 F (37.6 C) (Oral)  Resp 20  Ht  (1.676 m)  Wt 286 lb 2 oz (129.785 kg)  BMI  46.20 kg/m2  SpO2 100%  HEMODYNAMICS:   VENTILATOR SETTINGS: Vent Mode:  [-] PRVC FiO2 (%):  [40 %] 40 % Set Rate:  [16 bmp] 16 bmp Vt Set:  [470 mL] 470 mL PEEP:  [5 cmH20] 5 cmH20 Plateau Pressure:  [10 cmH20-18 cmH20] 18 cmH20  INTAKE / OUTPUT: I/O last 3 completed shifts: In: 9228.5 [I.V.:5668.5; NG/GT:2360; IV Piggyback:1200] Out: 3455 [Urine:3430; Drains:25]  PHYSICAL EXAMINATION: Gen: Sedated, Awakens to command, No distress HEENT: PERRL, no thyromegaly, JVD Neuro: No gross focal deficits CVS: RRR, no MRG RS: Clear, no wheze or crackles. Abd: lt flank dressing clean, dry. + BS, non tender, non distended Muskuloskeletal: Normal tone and bulk, no edema Skin- Intact.   LABS:  BMET  Recent Labs Lab 12/28/15 0440 12/29/15 0421 12/30/15 0405  NA 141 140 141  K 3.5 3.3* 3.2*  CL 106 105 106  CO2 BUN 9 <5* <5*  CREATININE 1.09* 0.80 0.72  GLUCOSE 79 114* 128*    Electrolytes  Recent Labs Lab 12/27/15 1821 12/28/15 0440 12/29/15 0421 12/30/15 0405  CALCIUM 7.1* 7.3* 7.4* 7.7*  MG 1.6*  --   --   --   PHOS 4.1  --   --   --     CBC  Recent Labs Lab 12/27/15 1821 12/28/15 0440 12/29/15 0421  WBC 9.4 6.6 5.4  HGB 8.0* 7.8* 7.9*  HCT 24.4* 24.0* 24.4*  PLT 216 194 204    Coag's  Recent Labs Lab 12/27/15 0032  APTT 33  INR 1.18    Sepsis Markers  Recent Labs Lab 12/26/15 1647 12/26/15 2128  LATICACIDVEN 1.64 0.67    ABG  Recent Labs Lab 12/26/15 2320  PHART 7.295*  PCO2ART 40.8  PO2ART 349.0*    Liver Enzymes  Recent Labs Lab 12/26/15 1638  AST 69*  ALT 42  ALKPHOS 93  BILITOT 1.1  ALBUMIN 2.6*    Cardiac Enzymes  Recent Labs Lab 12/26/15 2350  TROPONINI 0.08*    Glucose  Recent Labs Lab 12/29/15 0840 12/29/15 1241 12/29/15 1650 12/29/15 1931 12/29/15 2354 12/30/15 0341  GLUCAP 130* 109* 116* 112* 108* 115*    Imaging No results found.   STUDIES:    CULTURES: Blood culture  12/26/15 Wound culture 12/26/15 > MRSA  ANTIBIOTICS: Vanc 4/9 >> Zosyn 4/9 >> 4/12 Unasyn 4/12 >> Clinidamycin 4/9 >> 4/11  SIGNIFICANT EVENTS: I&D left flank abscess  LINES/TUBES: A-line 4/9 CVC 4/9 ETT 4/9 Foley 4/9  DISCUSSION: 9F w/ severe sepsis / septic shock and concern for necrotizing fasciitis of the left flank. PMH significant for IV drug abuse, hepatitis C and obesity.   ASSESSMENT / PLAN:  PULMONARY A: Need for mechanical ventilation / failure to extubate after surgery P:   Full vent support. Daily wake up and wean assessments Likely extubation today.   CARDIOVASCULAR A:  Septic shock with some component of medication induced hypotension P:  Weaned off pressors.  RENAL A:   Acute renal failure Cr 2.36 (baseline 0.98) > improved P:   Avoid nephrotoxins Maintain MAP >65 Monitor and correct lytes as needed Follow UOP and Cr   GASTROINTESTINAL A:   No acute issues Hx Hepatitis C P:   Protonix for stress ulcer prophylaxis. Continue tube feeds  HEMATOLOGIC A:   Anemia - Hgb 10.6 > 8.3 > 7.8, chronicity unclear No source of bleeding P:  Monitor. Transfuse for Hb < 7  INFECTIOUS A:   Septic shock secondary to left flank infection, MRSA in wound culture No evidence of necrotizing fasciitis HIV status negative.  P:   Abx narrowed to vancomycin, unasyn. Can further narrow to Vanco alone if all other cultures are negative.   ENDOCRINE A:   No acute issues  P:   Monitor  NEUROLOGIC A:   No acute issues P:   Monitor RASS Goal 0 to -1  FAMILY  - Updates: no family available - Inter-disciplinary family meet or Palliative Care meeting due by:  day 7.  Critical care time- 35 mins  Chilton GreathousePraveen Mikena Masoner MD [place] [facility] Pager [phone] If no answer or after 3pm call: [phone] 12/30/2015, 7:58 AM

## 2015-12-30 NOTE — Progress Notes (Signed)
[facility] FOR AM LAB REPLACEMENT ONLY  The patient does [facility] Protocol based on the criteria listed below:   1. Is GFR >/= 40 ml/min? Yes.    Patient's GFR today is >60 2. Is urine output >/= 0.5 ml/kg/hr for the last 6 hours? Yes.   Patient's UOP is 2.2 ml/kg/hr 3. Is BUN < 60 mg/dL? Yes.    Patient's BUN today is <5 4. Abnormal electrolyte(s): K3.2 5. Ordered repletion with: per protocol 6. If a panic level lab has been reported, has the CCM MD in charge been notified? Yes.  .   Physician:  Laural BenesS Sommer, MD  Melrose NakayamaChisholm, Kahlyn Shippey William 12/30/2015 5:49 AM

## 2015-12-30 NOTE — Progress Notes (Signed)
4 Days Post-Op  Subjective: She is awake watching TV on vent, able to mute volume.  CCM plans to extubate this AM.  She is off pressors.    Objective: Vital signs in last 24 hours: Temp:  [98.4 F (36.9 C)-99.6 F (37.6 C)] 99.6 F (37.6 C) (04/13 0402) Pulse Rate:  [62-96] 74 (04/13 0719) Resp:  [3-20] 20 (04/13 0719) BP: (90-122)/(50-82) 122/78 mmHg (04/13 0719) SpO2:  [97 %-100 %] 100 % (04/13 0719) Arterial Line BP: (75-145)/(51-134) 75/68 mmHg (04/13 0200) FiO2 (%):  [40 %] 40 % (04/13 0719) Weight:  [129.785 kg (286 lb 2 oz)] 129.785 kg (286 lb 2 oz) (04/13 0500)    Drains 25 Urine 2520 TF 950 IV:3889 Afebrile, VSS, BP improving off pressors Vent:  FIO2 40 K+ 3.2 Intake/Output from previous day: 04/12 0701 - 04/13 0700 In: 6539.8 [I.V.:3889.8; NG/GT:1700; IV Piggyback:950] Out: 2545 [Urine:2520; Drains:25] Intake/Output this shift:    General appearance: alert, cooperative, no distress and still intubated and TF via OG tube.   Resp: clear to auscultation bilaterally and anterior exam GI: soft, non-tender; bowel sounds normal; no masses,  no organomegaly and wound vac in place left flank  Lab Results:   Recent Labs  12/28/15 0440 12/29/15 0421  WBC 6.6 5.4  HGB 7.8* 7.9*  HCT 24.0* 24.4*  PLT 194 204    BMET  Recent Labs  12/29/15 0421 12/30/15 0405  NA 140 141  K 3.3* 3.2*  CL 105 106  CO2 25 26  GLUCOSE 114* 128*  BUN <5* <5*  CREATININE 0.80 0.72  CALCIUM 7.4* 7.7*   PT/INR No results for input(s): LABPROT, INR in the last 72 hours.   Recent Labs Lab 12/26/15 1638  AST 69*  ALT 42  ALKPHOS 93  BILITOT 1.1  PROT 6.5  ALBUMIN 2.6*     Lipase  No results found for: LIPASE   Studies/Results: No results found.  Medications: . ampicillin-sulbactam (UNASYN) IV  3 g Intravenous Q8H  . antiseptic oral rinse  7 mL Mouth Rinse QID  . chlorhexidine gluconate (SAGE KIT)  15 mL Mouth Rinse BID  . Chlorhexidine Gluconate Cloth  6 each  Topical Q0600  . enoxaparin (LOVENOX) injection  40 mg Subcutaneous Q24H  . mupirocin ointment  1 application Nasal BID  . pantoprazole (PROTONIX) IV  40 mg Intravenous Q24H  . potassium chloride  20 mEq Per Tube Q4H  . vancomycin  1,000 mg Intravenous Q8H   . dextrose 5 % and 0.9% NaCl 125 mL/hr at 12/30/15 0756  . feeding supplement (VITAL HIGH PROTEIN) 1,000 mL (12/30/15 0700)  . fentaNYL infusion INTRAVENOUS 275 mcg/hr (12/30/15 0751)  . lactated ringers Stopped (12/29/15 0800)  . norepinephrine (LEVOPHED) Adult infusion Stopped (12/28/15 1100)   Assessment/Plan Left flank abscess with Sepsis I&D 12/26/15 140 Sq cm skin and fat Hypotension with pressor support Acute renal failure Acute respiratory failure - still on Vent Hx of IV drug use Hx of hepatitis C Anemia   Positive MRSA screen Body mass index is 46.2   Antibiotics:  Day 5: vancomycin; Zosyn x 3.5 days, now on day 2 Unasyn  MRSA wound culture    CLINDAMYCIN Resistant >=8 RESISTANT Final     Method: MIC     ERYTHROMYCIN Resistant >=8 RESISTANT Final     Method: MIC     GENTAMICIN Sensitive <=0.5 SENSITIVE Final     Method: MIC     LEVOFLOXACIN Intermediate 4 INTERMEDIATE Final  Method: MIC     OXACILLIN Resistant >=4 RESISTANT Final     Method: MIC     RIFAMPIN Sensitive <=0.5 SENSITIVE Final     Method: MIC     TETRACYCLINE Sensitive <=1 SENSITIVE Final     Method: MIC     TRIMETH/SULFA Sensitive <=10 SENSITIVE Final     Method: MIC     VANCOMYCIN Sensitive <=0.5 SENSITIVE Final     DVT:  Lovenox/SCD  Plan:  CCM plans to extubate today, she seems to be tolerating tube feeds, so we could start her back on PO's when she is swallowing safely.  I would start with clears and advance rapidly. I will add a magnesium and  replacing K+ via NG currently.  I will add K+ to IV also, and decrease rate to 100 ml/hr.  Mag 1.6, will start replacing.  LOS: 4 days    Kathleen Frederick 12/30/2015 [phone]

## 2015-12-30 NOTE — Progress Notes (Signed)
125 ml IV Fent wasted with Andreas Bloweraroline Fowler, RN.   Benjamine SpragueYates, [place] Dohmen A

## 2015-12-30 NOTE — Procedures (Signed)
Extubation Procedure Note  Patient Details:   Name: Kenyia Cimo DOB: [birth year] MRN: [id number]   Airway Documentation:     Evaluation  O2 sats: stable throughout Complications: No apparent complications Patient did tolerate procedure well. Bilateral Breath Sounds: Clear, Diminished   Yes   Pt extubated to 3L [place] per MD order. Pt stable throughout with no complications. Pt able to speak name and has a strong productive cough post extubation. Pt with diminished BS throughout. Pt instructed on Incentive Spirometer and achieved > 1250 cc for each breath.  RN at bedside and aware. RT will continue to monitor.   Carolan ShiverKelley, Patric Vanpelt M 12/30/2015, 8:49 AM

## 2015-12-30 NOTE — Progress Notes (Signed)
Pharmacy Antibiotic Note  Kathleen Frederick is a 35 y.o. female admitted on 12/26/2015 with pneumonia, sepsis and cellulitis, also noted w/ ARF, s/p I&D in OR for concern for necrotizing fasciitis, found necrotic fat but no involvement in fascia or muscle.   Tmax 99.6, wbc normal at 5. Elevated vancomycin trough (27) this morning (am dose already given) Ke 0.046, t 1/2 - 15h  Plan: Decrease vancomycin to 1g IV q12 hours - start next dose tonight at 2200 (~14h since last dose to allow clearance today).   Height: 5\' 6"  (167.6 cm) Weight: 286 lb 2 oz (129.785 kg) IBW/kg (Calculated) : 59.3  Temp (24hrs), Avg:98.7 F (37.1 C), Min:98 F (36.7 C), Max:99.6 F (37.6 C)   Recent Labs Lab 12/26/15 1638 12/26/15 1647 12/26/15 2128 12/27/15 0428 12/27/15 1821 12/28/15 0440 12/29/15 0421 12/30/15 0405 12/30/15 0730  WBC 19.9*  --   --  12.6* 9.4 6.6 5.4  --   --   CREATININE 2.35*  --   --  1.51* 1.11* 1.09* 0.80 0.72  --   LATICACIDVEN  --  1.64 0.67  --   --   --   --   --   --   VANCOTROUGH  --   --   --   --   --   --   --   --  27*    Estimated Creatinine Clearance: 139.5 mL/min (by C-G formula based on Cr of 0.72).    No Known Allergies  Vanc 4/9>> Zosyn 4/9>>4/12 Unasyn 4/12>> Clinda 4/10>>4/11  4/9 MRSA PCR + 4/9 abscess >> MRSA 4/9 UCx: ng=F 4/9 BCx: ngtd  Sheppard CoilFrank Wilson PharmD., BCPS Clinical Pharmacist Pager [phone] 12/30/2015 10:21 AM

## 2015-12-31 DIAGNOSIS — J988 Other specified respiratory disorders: Secondary | ICD-10-CM

## 2015-12-31 LAB — GLUCOSE, CAPILLARY
GLUCOSE-CAPILLARY: 89 mg/dL (ref 65–99)
GLUCOSE-CAPILLARY: 87 mg/dL (ref 65–99)
GLUCOSE-CAPILLARY: 94 mg/dL (ref 65–99)
GLUCOSE-CAPILLARY: 90 mg/dL (ref 65–99)
Glucose-Capillary: 98 mg/dL (ref 65–99)

## 2015-12-31 LAB — BASIC METABOLIC PANEL
ANION GAP: 6 (ref 5–15)
CHLORIDE: 105 mmol/L (ref 101–111)
CO2: 28 mmol/L (ref 22–32)
Calcium: 8 mg/dL — ABNORMAL LOW (ref 8.9–10.3)
Creatinine, Ser: 0.63 mg/dL (ref 0.44–1.00)
GFR calc Af Amer: >60 mL/min (ref >60)
GLUCOSE: 115 mg/dL — AB (ref 65–99)
POTASSIUM: 4 mmol/L (ref 3.5–5.1)
Sodium: 139 mmol/L (ref 135–145)

## 2015-12-31 LAB — CULTURE, BLOOD (ROUTINE X 2)
CULTURE: NO GROWTH
Culture: NO GROWTH

## 2015-12-31 LAB — CBC
HEMATOCRIT: 27.9 % — AB (ref 36.0–46.0)
HEMOGLOBIN: 8.9 g/dL — AB (ref 12.0–15.0)
MCH: 27 pg (ref 26.0–34.0)
MCHC: 31.9 g/dL (ref 30.0–36.0)
MCV: 84.5 fL (ref 78.0–100.0)
Platelets: 253 10*3/uL (ref 150–400)
RBC: 3.3 MIL/uL — ABNORMAL LOW (ref 3.87–5.11)
RDW: 14.3 % (ref 11.5–15.5)
WBC: 7.1 10*3/uL (ref 4.0–10.5)

## 2015-12-31 MED ORDER — FENTANYL CITRATE (PF) 100 MCG/2ML IJ SOLN
INTRAMUSCULAR | Status: AC
  Filled 2015-12-31: qty 4

## 2015-12-31 MED ORDER — FENTANYL CITRATE (PF) 100 MCG/2ML IJ SOLN
200.0000 ug | Freq: Once | INTRAMUSCULAR | Status: AC
  Administered 2015-12-31: 200 ug via INTRAVENOUS

## 2015-12-31 MED ORDER — ADULT MULTIVITAMIN W/MINERALS CH
1.0000 | ORAL_TABLET | Freq: Every day | ORAL | Status: DC
  Administered 2015-12-31 – 2016-01-05 (×6): 1 via ORAL
  Filled 2015-12-31 (×6): qty 1

## 2015-12-31 MED ORDER — MIDAZOLAM HCL 2 MG/2ML IJ SOLN
INTRAMUSCULAR | Status: AC
  Filled 2015-12-31: qty 4

## 2015-12-31 MED ORDER — MIDAZOLAM HCL 2 MG/2ML IJ SOLN
4.0000 mg | Freq: Once | INTRAMUSCULAR | Status: AC
  Administered 2015-12-31: 4 mg via INTRAVENOUS

## 2015-12-31 MED ORDER — FENTANYL CITRATE (PF) 100 MCG/2ML IJ SOLN
400.0000 ug | Freq: Once | INTRAMUSCULAR | Status: AC
  Administered 2015-12-31: 400 ug via INTRAVENOUS

## 2015-12-31 MED ORDER — ENSURE ENLIVE PO LIQD
237.0000 mL | Freq: Two times a day (BID) | ORAL | Status: DC
  Administered 2015-12-31 – 2016-01-03 (×7): 237 mL via ORAL

## 2015-12-31 MED ORDER — FENTANYL CITRATE (PF) 100 MCG/2ML IJ SOLN
INTRAMUSCULAR | Status: AC
  Filled 2015-12-31: qty 8

## 2015-12-31 NOTE — Progress Notes (Signed)
PCCM PROGRESS NOTE  Subjective: Denies sore throat, dyspnea, or chest pain.  Vital signs: BP 112/67 mmHg  Pulse 66  Temp(Src) 98 F (36.7 C) (Oral)  Resp 13  Ht 5\' 6"  (1.676 m)  Wt 286 lb 2 oz (129.785 kg)  BMI 46.20 kg/m2  SpO2 97%  Intake/output: I/O last 3 completed shifts: In: 7157.1 [P.O.:660; I.V.:4337.1; NG/GT:1010; IV Piggyback:1150] Out: 7750 [Urine:7600; Drains:150]   General: pleasant HEENT: no stridor Cardiac: regular Chest: no wheeze Abd: soft, non tender Ext: no edema Neuro: normal strength   CMP Latest Ref Rng 12/31/2015 12/30/2015 12/29/2015  Glucose 65 - 99 mg/dL 161(W115(H) 960(A128(H) 540(J114(H)  BUN 6 - 20 mg/dL <8(J<5(L) <1(B<5(L) <1(Y<5(L)  Creatinine 0.44 - 1.00 mg/dL 7.820.63 9.560.72 2.130.80  Sodium 135 - 145 mmol/L 139 141 140  Potassium 3.5 - 5.1 mmol/L 4.0 3.2(L) 3.3(L)  Chloride 101 - 111 mmol/L 105 106 105  CO2 22 - 32 mmol/L 28 26 25   Calcium 8.9 - 10.3 mg/dL 8.0(L) 7.7(L) 7.4(L)    CBC Latest Ref Rng 12/31/2015 12/29/2015 12/28/2015  WBC 4.0 - 10.5 K/uL 7.1 5.4 6.6  Hemoglobin 12.0 - 15.0 g/dL 0.8(M8.9(L) 7.9(L) 7.8(L)  Hematocrit 36.0 - 46.0 % 27.9(L) 24.4(L) 24.0(L)  Platelets 150 - 400 K/uL 253 204 194    CBG (last 3)   Recent Labs  12/30/15 1929 12/30/15 2338 12/31/15 0402  GLUCAP 121* 92 89    Assessment: Compromised airway in setting of septic shock >> extubated 4/13 Lt flank MRSA abscess with septic shock s/p I&D AKI >> resolved Hypokalemia >> improved Anemia of critical illness Hx of IVDA Hx of Hep C Hx of Anxiety  Plan: Post-op care per CCS Bronchial hygiene Mobilize as tolerated Day 6 of Abx, currently on unasyn, vancomycin  PCCM will sign off.  If additional medical assistance needed, then recommend consulting [facility].  [facility].  Coralyn HellingVineet Ananda Caya, MD [facility] 12/31/2015, 8:34 AM Pager:  [phone] After 3pm call: [phone]

## 2015-12-31 NOTE — Progress Notes (Signed)
5 Days Post-Op  Subjective: Pt with no acute changes overnight  Objective: Vital signs in last 24 hours: Temp:  [97.7 F (36.5 C)-98.3 F (36.8 C)] 98 F (36.7 C) (04/14 0400) Pulse Rate:  [66-87] 66 (04/14 0600) Resp:  [0-20] 13 (04/14 0600) BP: (97-124)/(56-91) 112/67 mmHg (04/14 0600) SpO2:  [96 %-100 %] 97 % (04/14 0600) FiO2 (%):  [40 %] 40 % (04/13 0800)    Intake/Output from previous day: 04/13 0701 - 04/14 0700 In: 3662.1 [P.O.:660; I.V.:2082.1; NG/GT:170; IV Piggyback:750] Out: 4175 [Urine:4075; Drains:100] Intake/Output this shift:    General appearance: alert and cooperative Incision/Wound: vac in place   Lab Results:   Recent Labs  12/29/15 0421 12/31/15 0445  WBC 5.4 7.1  HGB 7.9* 8.9*  HCT 24.4* 27.9*  PLT 204 253   BMET  Recent Labs  12/30/15 0405 12/31/15 0445  NA 141 139  K 3.2* 4.0  CL 106 105  CO2 26 28  GLUCOSE 128* 115*  BUN <5* <5*  CREATININE 0.72 0.63  CALCIUM 7.7* 8.0*   Anti-infectives: Anti-infectives    Start     Dose/Rate Route Frequency Ordered Stop   12/30/15 2200  vancomycin (VANCOCIN) IVPB 1000 mg/200 mL premix     1,000 mg 200 mL/hr over 60 Minutes Intravenous Every 12 hours 12/30/15 1020     12/29/15 1200  Ampicillin-Sulbactam (UNASYN) 3 g in sodium chloride 0.9 % 100 mL IVPB     3 g 100 mL/hr over 60 Minutes Intravenous Every 8 hours 12/29/15 1053     12/28/15 1600  vancomycin (VANCOCIN) IVPB 1000 mg/200 mL premix  Status:  Discontinued     1,000 mg 200 mL/hr over 60 Minutes Intravenous Every 8 hours 12/28/15 1441 12/30/15 1016   12/27/15 0800  vancomycin (VANCOCIN) 1,250 mg in sodium chloride 0.9 % 250 mL IVPB  Status:  Discontinued     1,250 mg 166.7 mL/hr over 90 Minutes Intravenous Every 24 hours 12/26/15 2352 12/28/15 1441   12/27/15 0000  clindamycin (CLEOCIN) IVPB 900 mg  Status:  Discontinued     900 mg 100 mL/hr over 30 Minutes Intravenous Every 8 hours 12/26/15 2324 12/28/15 0909   12/27/15 0000   piperacillin-tazobactam (ZOSYN) IVPB 3.375 g  Status:  Discontinued     3.375 g 12.5 mL/hr over 240 Minutes Intravenous Every 8 hours 12/26/15 2352 12/29/15 1053   12/26/15 1815  vancomycin (VANCOCIN) IVPB 1000 mg/200 mL premix     1,000 mg 200 mL/hr over 60 Minutes Intravenous  Once 12/26/15 1806 12/26/15 2037   12/26/15 1815  piperacillin-tazobactam (ZOSYN) IVPB 3.375 g     3.375 g 100 mL/hr over 30 Minutes Intravenous  Once 12/26/15 1806 12/26/15 1938      Assessment/Plan: s/p Procedure(s): INCISION AND DRAINAGE ABSCESS (Left) Vac dressing today hopefully at bedside con't abx   LOS: 5 days    Marigene Ehlersamirez Jr., [facility] 12/31/2015

## 2015-12-31 NOTE — Progress Notes (Signed)
Patient ID: Kathleen Frederick, female   DOB: 1981-01-17, 35 y.o.   MRN: [id number]  [facility] at bedside with Armen PickupMelody Austin, RN. Conscious sedation attempted with 4mg  Versed and 600mcg Fentanyl but without much effect. White foam placed in wound bed to facilitate next change. Suggest 6mg  Versed at next change.    Freeman CaldronMichael J. Zellie Jenning, PA-C Pager: [phone] General Trauma PA Pager: [phone]

## 2015-12-31 NOTE — Consult Note (Signed)
WOC wound consult note Reason for Consult: NPWT VAC dressing change with CCS and conscious sedation  S/P left flank abscess with incision/drainge, and sharp debridement Wound type:surgical  Measurement: 25cm x 5cm x 4.5cm aprox Wound ZOX:[id number]:subcutaneous tissues clean, some bleeding at the distal edge of the wound Drainage (amount, consistency, odor) moderate, serosanguinous  Periwound: intact  Dressing procedure/placement/frequency: Old dressings removed per CCS.  Conscious sedation attempted with 4mg  of Versed and 600mg  of Fentanyl given by bedside nurses and respiratory therapy in the room for monitoring. 2 pc of white foam used in the base, 1pc of black foam used to fill the remainder of the defect. Sealed with drape and NPWT at 125mmHG obtained.  Patient was awake and verbal through the dressing, however by the time we were placing the drape she seemed more comfortable.  WOC will need to complete dressing changes with CCS team while conscious sedation is needed.  WOC team will follow along with you. Shealeigh Dunstan Druid HillsAustin RN,CWOCN [phone]

## 2015-12-31 NOTE — Progress Notes (Signed)
Nutrition Follow-up  DOCUMENTATION CODES:   Morbid obesity  INTERVENTION:   -Ensure Enlive po BID, each supplement provides 350 kcal and 20 grams of protein -MVI daily  NUTRITION DIAGNOSIS:   Increased nutrient needs related to wound healing as evidenced by estimated needs.  Ongoing  GOAL:   Patient will meet greater than or equal to 90% of their needs  Progressing  MONITOR:   PO intake, Supplement acceptance, Labs, Weight trends, Skin, I & O's  REASON FOR ASSESSMENT:   Consult Enteral/tube feeding initiation and management  ASSESSMENT:   Kathleen Frederick who presented to the ED with complaints of left flank pain. She was found to be hypotensive. She has a history of iv drug abuse, hepatitis C and anxiety. Initial labs showed acute renal failure with Cr 2.36 and leukocytosis at 19.9. Blood cultures were drawn. She was given IVF and antibiotics, then taken to the OR urgently for debridement out of concern for necrotizing fasciitis. She was intubated and central line and a-line were placed. Intraoperatively she required intermittent pressor support. The surgeon noted necrotic fat, but did not feel that the underlying fascia or muscle were involved. Postoperatively she was admitted to 2S. She remains intubated.   Patient s/p procedure 4/9: INCISION, DRAINAGE AND SHARP DEBRIDEMENT OF LEFT FLANK ABSCESS  Pt extubated on 12/31/15.   Spoke with pt at bedside. She reports her appetite was good PTA and denies any weight loss. She reports she was only able to consume a few bites of eggs this morning secondary to pain. She reports she ate about 50% of her dinner last night and tolerated clear liquids well. Pt expresses desire to to eat, however, difficult to consume large amounts due to pain.   Discussed importance of good intake (particularly protein sources) to promote wound healing. Pt amenable to Ensure supplements; RD to order.   Case discussed with RN and CWOCN. Pt not eating well  secondary to pain. Plan is for pt to undergo vac change today.   Labs reviewed: CBGS: 87-121.  Diet Order:  Diet regular Room service appropriate?: Yes; Fluid consistency:: Thin  Skin:  Wound (see comment) (wound vac lt flank)  Last BM:  PTA  Height:   Ht Readings from Last 1 Encounters:  12/26/15 5\' 6"  (1.676 m)    Weight:   Wt Readings from Last 1 Encounters:  12/30/15 286 lb 2 oz (129.785 kg)    Ideal Body Weight:  59 kg  BMI:  Body mass index is 46.2 kg/(m^2).  Estimated Nutritional Needs:   Kcal:  1320-1680  Protein:  145-155 gm  Fluid:  per MD  EDUCATION NEEDS:   Education needs addressed  Kathleen Frederick, RD, LDN, CDE Pager: [phone] After hours Pager: [phone]

## 2016-01-01 LAB — ANAEROBIC CULTURE

## 2016-01-01 LAB — GLUCOSE, CAPILLARY
GLUCOSE-CAPILLARY: 90 mg/dL (ref 65–99)
GLUCOSE-CAPILLARY: 88 mg/dL (ref 65–99)
Glucose-Capillary: 83 mg/dL (ref 65–99)

## 2016-01-01 MED ORDER — MOMETASONE FURO-FORMOTEROL FUM 200-5 MCG/ACT IN AERO
2.0000 | INHALATION_SPRAY | Freq: Two times a day (BID) | RESPIRATORY_TRACT | Status: DC
  Administered 2016-01-01 – 2016-01-05 (×7): 2 via RESPIRATORY_TRACT
  Filled 2016-01-01: qty 8.8

## 2016-01-01 MED ORDER — ALBUTEROL SULFATE (2.5 MG/3ML) 0.083% IN NEBU: 3.0000 mL | INHALATION_SOLUTION | RESPIRATORY_TRACT | Status: DC | PRN

## 2016-01-01 MED ORDER — TRAMADOL HCL 50 MG PO TABS
100.0000 mg | ORAL_TABLET | Freq: Two times a day (BID) | ORAL | Status: DC | PRN
  Administered 2016-01-01: 100 mg via ORAL
  Filled 2016-01-01: qty 2

## 2016-01-01 MED ORDER — OXYCODONE HCL 5 MG PO TABS
10.0000 mg | ORAL_TABLET | ORAL | Status: DC | PRN
  Administered 2016-01-01 (×4): 10 mg via ORAL
  Filled 2016-01-01 (×4): qty 2

## 2016-01-01 MED ORDER — PREGABALIN 75 MG PO CAPS
200.0000 mg | ORAL_CAPSULE | Freq: Two times a day (BID) | ORAL | Status: DC
  Administered 2016-01-01 – 2016-01-05 (×9): 200 mg via ORAL
  Filled 2016-01-01 (×2): qty 2
  Filled 2016-01-01: qty 1
  Filled 2016-01-01 (×6): qty 2

## 2016-01-01 NOTE — Progress Notes (Signed)
Obtained patient's valuables from security per her request. Opened valuables in fron of patient. Patient disagrees with cash amount. Stated she had $166.00. Cash in wallet only equals $66.00.  patient will not sign  Valuables envelope. Informed charge nurse and security.

## 2016-01-01 NOTE — Progress Notes (Signed)
6 Days Post-Op  Subjective: No complaints today  Objective: Vital signs in last 24 hours: Temp:  [97.7 F (36.5 C)-98.4 F (36.9 C)] 98.3 F (36.8 C) (04/15 0356) Pulse Rate:  [62-106] 89 (04/15 0600) Resp:  [9-27] 20 (04/15 0600) BP: (95-134)/(53-98) 103/56 mmHg (04/15 0600) SpO2:  [90 %-100 %] 90 % (04/15 0600) Weight:  [127.659 kg (281 lb 7 oz)] 127.659 kg (281 lb 7 oz) (04/15 0500) Last BM Date: 12/31/15  Intake/Output from previous day: 04/14 0701 - 04/15 0700 In: 2468.3 [P.O.:480; I.V.:1288.3; IV Piggyback:700] Out: 7829 [[id number] [Urine:4625; Drains:25] Intake/Output this shift:    Resp: clear to auscultation bilaterally Cardio: regular rate and rhythm Incision/Wound:vac in place functional no cellulitis   Lab Results:   Recent Labs  12/31/15 0445  WBC 7.1  HGB 8.9*  HCT 27.9*  PLT 253   BMET  Recent Labs  12/30/15 0405 12/31/15 0445  NA 141 139  K 3.2* 4.0  CL 106 105  CO2 26 28  GLUCOSE 128* 115*  BUN <5* <5*  CREATININE 0.72 0.63  CALCIUM 7.7* 8.0*   PT/INR No results for input(s): LABPROT, INR in the last 72 hours. ABG No results for input(s): PHART, HCO3 in the last 72 hours.  Invalid input(s): PCO2, PO2  Studies/Results: No results found.  Anti-infectives: Anti-infectives    Start     Dose/Rate Route Frequency Ordered Stop   12/30/15 2200  vancomycin (VANCOCIN) IVPB 1000 mg/200 mL premix     1,000 mg 200 mL/hr over 60 Minutes Intravenous Every 12 hours 12/30/15 1020     12/29/15 1200  Ampicillin-Sulbactam (UNASYN) 3 g in sodium chloride 0.9 % 100 mL IVPB     3 g 100 mL/hr over 60 Minutes Intravenous Every 8 hours 12/29/15 1053     12/28/15 1600  vancomycin (VANCOCIN) IVPB 1000 mg/200 mL premix  Status:  Discontinued     1,000 mg 200 mL/hr over 60 Minutes Intravenous Every 8 hours 12/28/15 1441 12/30/15 1016   12/27/15 0800  vancomycin (VANCOCIN) 1,250 mg in sodium chloride 0.9 % 250 mL IVPB  Status:  Discontinued     1,250 mg 166.7  mL/hr over 90 Minutes Intravenous Every 24 hours 12/26/15 2352 12/28/15 1441   12/27/15 0000  clindamycin (CLEOCIN) IVPB 900 mg  Status:  Discontinued     900 mg 100 mL/hr over 30 Minutes Intravenous Every 8 hours 12/26/15 2324 12/28/15 0909   12/27/15 0000  piperacillin-tazobactam (ZOSYN) IVPB 3.375 g  Status:  Discontinued     3.375 g 12.5 mL/hr over 240 Minutes Intravenous Every 8 hours 12/26/15 2352 12/29/15 1053   12/26/15 1815  vancomycin (VANCOCIN) IVPB 1000 mg/200 mL premix     1,000 mg 200 mL/hr over 60 Minutes Intravenous  Once 12/26/15 1806 12/26/15 2037   12/26/15 1815  piperacillin-tazobactam (ZOSYN) IVPB 3.375 g     3.375 g 100 mL/hr over 30 Minutes Intravenous  Once 12/26/15 1806 12/26/15 1938      Assessment/Plan: Sepsis POD#6 I&D left flank abscess---Dr. Magnus IvanBlackman Wound- change vac on monday ID-dc unasyn, will continue vanc, cultures with mrsa tx to floor today  [facility],Estoria Geary 01/01/2016

## 2016-01-01 NOTE — Progress Notes (Signed)
Pt transferred to 6N04 with belongings. Report given to receiving RN and all questions answered. VSS during transfer.  Pt assisted to bed in new room. Receiving RN in room. Family updated on patient's location.

## 2016-01-02 LAB — VANCOMYCIN, TROUGH: Vancomycin Tr: 21 ug/mL — ABNORMAL HIGH (ref 10.0–20.0)

## 2016-01-02 MED ORDER — HYDROMORPHONE HCL 1 MG/ML IJ SOLN
1.0000 mg | INTRAMUSCULAR | Status: DC | PRN
  Administered 2016-01-02 – 2016-01-04 (×8): 1 mg via INTRAVENOUS
  Filled 2016-01-02 (×9): qty 1

## 2016-01-02 MED ORDER — VANCOMYCIN HCL IN DEXTROSE 750-5 MG/150ML-% IV SOLN
750.0000 mg | Freq: Two times a day (BID) | INTRAVENOUS | Status: DC
  Administered 2016-01-02 – 2016-01-04 (×4): 750 mg via INTRAVENOUS
  Filled 2016-01-02 (×5): qty 150

## 2016-01-02 MED ORDER — ENOXAPARIN SODIUM 30 MG/0.3ML ~~LOC~~ SOLN
30.0000 mg | Freq: Two times a day (BID) | SUBCUTANEOUS | Status: DC
  Administered 2016-01-02 – 2016-01-05 (×7): 30 mg via SUBCUTANEOUS
  Filled 2016-01-02 (×7): qty 0.3

## 2016-01-02 MED ORDER — DOCUSATE SODIUM 100 MG PO CAPS
100.0000 mg | ORAL_CAPSULE | Freq: Two times a day (BID) | ORAL | Status: DC
  Administered 2016-01-02 – 2016-01-05 (×7): 100 mg via ORAL
  Filled 2016-01-02 (×7): qty 1

## 2016-01-02 MED ORDER — OXYCODONE HCL 5 MG PO TABS
10.0000 mg | ORAL_TABLET | ORAL | Status: DC | PRN
  Administered 2016-01-02 – 2016-01-05 (×13): 20 mg via ORAL
  Filled 2016-01-02: qty 4
  Filled 2016-01-02: qty 2
  Filled 2016-01-02 (×2): qty 4
  Filled 2016-01-02: qty 2
  Filled 2016-01-02 (×9): qty 4

## 2016-01-02 MED ORDER — NAPROXEN 250 MG PO TABS
500.0000 mg | ORAL_TABLET | Freq: Two times a day (BID) | ORAL | Status: DC
  Administered 2016-01-02 – 2016-01-05 (×6): 500 mg via ORAL
  Filled 2016-01-02 (×6): qty 2

## 2016-01-02 MED ORDER — SODIUM CHLORIDE 0.9% FLUSH
10.0000 mL | INTRAVENOUS | Status: DC | PRN
  Administered 2016-01-02 – 2016-01-03 (×2): 10 mL
  Filled 2016-01-02 (×2): qty 40

## 2016-01-02 MED ORDER — POLYETHYLENE GLYCOL 3350 17 G PO PACK
17.0000 g | PACK | Freq: Every day | ORAL | Status: DC
  Administered 2016-01-02: 17 g via ORAL
  Filled 2016-01-02 (×3): qty 1

## 2016-01-02 MED ORDER — TRAMADOL HCL 50 MG PO TABS
100.0000 mg | ORAL_TABLET | Freq: Four times a day (QID) | ORAL | Status: DC
  Administered 2016-01-02 – 2016-01-05 (×11): 100 mg via ORAL
  Filled 2016-01-02 (×13): qty 2

## 2016-01-02 NOTE — Progress Notes (Signed)
Pharmacy Antibiotic Note  Kathleen Frederick is a 35 y.o. female admitted on 12/26/2015 with pneumonia, sepsis and cellulitis, also noted w/ ARF.  Day #8 of abx for MRSA abscess / cellulitis. S/p I&D of L flank abscess. May need repeat I&D, no evidence of necrotizing fasciitis. VT today was drawn late but last dose was given late as well. It was borderline high at 21. No Bmet in a few days, will check one tomorrow. Afebrile, WBC wnl.  Plan: Decrease vancomycin to 750mg  IV Q12 Monitor clinical picture, renal function, VT prn F/U LOT Check Bmet tomorrow  Height: 5\' 6"  (167.6 cm) Weight: 270 lb (122.471 kg) IBW/kg (Calculated) : 59.3  Temp (24hrs), Avg:98 F (36.7 C), Min:97.9 F (36.6 C), Max:98.1 F (36.7 C)   Recent Labs Lab 12/26/15 1647 12/26/15 2128 12/27/15 0428 12/27/15 1821 12/28/15 0440 12/29/15 0421 12/30/15 0405 12/30/15 0730 12/31/15 0445 01/02/16 1200  WBC  --   --  12.6* 9.4 6.6 5.4  --   --  7.1  --   CREATININE  --   --  1.51* 1.11* 1.09* 0.80 0.72  --  0.63  --   LATICACIDVEN 1.64 0.67  --   --   --   --   --   --   --   --   VANCOTROUGH  --   --   --   --   --   --   --  27*  --  21*    Estimated Creatinine Clearance: 134.8 mL/min (by C-G formula based on Cr of 0.63).    No Known Allergies  Abx Vanc 4/9 >> Zosyn 4/9 >> 4/12 Unasyn 4/12 >> 4/15 Clinda 4/10 >> 4/11  Levels 4/13 VT = 27 (Dose decreased to 1g IV Q12 4/16 VT = 21 (Last dose given late but trough was drawn late as well; dose decreased slightly)  Cx's 4/9 MRSA PCR + 4/9 abscess >> MRSA 4/9 UCx: ng=F 4/9 BCx: ngtd  Enzo BiNathan Clark Clowdus, PharmD, BCPS Clinical Pharmacist Pager [phone] 01/02/2016 12:53 PM

## 2016-01-02 NOTE — Progress Notes (Signed)
Patient ID: Kathleen Frederick, female   DOB: December 17, 1980, 35 y.o.   MRN: [id number]   LOS: 7 days   Subjective: No new c/o, oral pain meds not doing much for her, using a lot of IV dilaudid.   Objective: Vital signs in last 24 hours: Temp:  [97.8 F (36.6 C)-98.1 F (36.7 C)] 98.1 F (36.7 C) (04/16 0549) Pulse Rate:  [71-85] 76 (04/16 0549) Resp:  [12-18] 18 (04/16 0549) BP: (100-126)/(53-87) 100/53 mmHg (04/16 0549) SpO2:  [94 %-100 %] 96 % (04/16 0549) Weight:  [122.471 kg (270 lb)] 122.471 kg (270 lb) (04/15 1253) Last BM Date: 01/01/16   Physical Exam General appearance: alert and no distress Resp: clear to auscultation bilaterally Cardio: regular rate and rhythm GI: normal findings: bowel sounds normal and soft, non-tender Incision/Wound:VAC in place   Assessment/Plan: Sepsis POD#6 I&D left flank abscess---Dr. Magnus IvanBlackman Wound- change vac on monday ID- vanc with mrsa FEN -- Increase OxyIR, schedule tramadol, add NSAID VTE -- Lovenox Dispo -- Dressing changes    Freeman CaldronMichael J. Shameka Aggarwal, PA-C Pager: [phone] General Trauma PA Pager: [phone]  01/02/2016

## 2016-01-03 LAB — BASIC METABOLIC PANEL
Anion gap: 11 (ref 5–15)
BUN: 6 mg/dL (ref 6–20)
CALCIUM: 8.7 mg/dL — AB (ref 8.9–10.3)
CO2: 27 mmol/L (ref 22–32)
CREATININE: 0.86 mg/dL (ref 0.44–1.00)
Chloride: 100 mmol/L — ABNORMAL LOW (ref 101–111)
GFR calc Af Amer: >60 mL/min (ref >60)
GLUCOSE: 105 mg/dL — AB (ref 65–99)
Potassium: 3.8 mmol/L (ref 3.5–5.1)
Sodium: 138 mmol/L (ref 135–145)

## 2016-01-03 LAB — TRIGLYCERIDES: Triglycerides: 157 mg/dL — ABNORMAL HIGH (ref <150)

## 2016-01-03 MED ORDER — HYDROMORPHONE HCL 1 MG/ML IJ SOLN
2.0000 mg | Freq: Once | INTRAMUSCULAR | Status: AC
  Administered 2016-01-03: 2 mg via INTRAVENOUS
  Filled 2016-01-03: qty 2

## 2016-01-03 MED ORDER — HYDROMORPHONE HCL 2 MG/ML IJ SOLN: 4.0000 mg | Freq: Once | INTRAMUSCULAR | Status: DC

## 2016-01-03 MED ORDER — HYDROMORPHONE HCL 1 MG/ML IJ SOLN
2.0000 mg | Freq: Once | INTRAMUSCULAR | Status: AC
  Administered 2016-01-03: 2 mg via INTRAVENOUS

## 2016-01-03 MED ORDER — FENTANYL CITRATE (PF) 100 MCG/2ML IJ SOLN
400.0000 ug | Freq: Once | INTRAMUSCULAR | Status: DC
  Filled 2016-01-03: qty 12

## 2016-01-03 MED ORDER — MIDAZOLAM HCL 2 MG/2ML IJ SOLN: 4.0000 mg | Freq: Once | INTRAMUSCULAR | Status: DC

## 2016-01-03 MED ORDER — HYDROMORPHONE HCL 1 MG/ML IJ SOLN
INTRAMUSCULAR | Status: AC
  Filled 2016-01-03: qty 4

## 2016-01-03 NOTE — Progress Notes (Signed)
Patient ID: Kathleen Frederick, female   DOB: 1981-05-27, 35 y.o.   MRN: [id number]     [facility]      [address] [place], [place] [address]    Phone: [phone] FAX: [phone]     Subjective: Pain meds work for 30 mins or so. Afebrile.  VSS.   Objective:  Vital signs:  Filed Vitals:   01/01/16 2012 01/01/16 2055 01/02/16 0549 01/02/16 1524  BP:  126/70 100/53 115/75  Pulse:  85 76 80  Temp:  97.9 F (36.6 C) 98.1 F (36.7 C) 98 F (36.7 C)  TempSrc:  Oral Oral Oral  Resp:  _0 Height:      Weight:      SpO2: 99% 97% 96% 98%    Last BM Date: 01/01/16  Intake/Output   Yesterday:  04/16 0701 - 04/17 0700 In: 1080 [P.O.:1080] Out: 6286 [Urine:1350; Drains:25] This shift: I/O last 3 completed shifts: In: 1080 [P.O.:1080] Out: 3817 [Urine:3400; Drains:50]    Physical Exam: General: Pt awake/alert/oriented x4 in no acute distress  Skin: left flank dressing--c/d/i, serosanguinous drainage.   Problem List:   Active Problems:   Septic shock (HCC)   Abscess of flank    Results:   Labs: Results for orders placed or performed during the hospital encounter of 12/26/15 (from the past 48 hour(s))  Glucose, capillary     Status: None   Collection Time: 01/01/16  9:14 AM  Result Value Ref Range   Glucose-Capillary 88 65 - 99 mg/dL   Comment 1 Notify RN   Vancomycin, trough     Status: Abnormal   Collection Time: 01/02/16 12:00 PM  Result Value Ref Range   Vancomycin Tr 21 (H) 10.0 - 20.0 ug/mL  Basic metabolic panel     Status: Abnormal   Collection Time: 01/03/16  5:00 AM  Result Value Ref Range   Sodium 138 135 - 145 mmol/L   Potassium 3.8 3.5 - 5.1 mmol/L   Chloride 100 (L) 101 - 111 mmol/L   CO2 27 22 - 32 mmol/L   Glucose, Bld 105 (H) 65 - 99 mg/dL   BUN 6 6 - 20 mg/dL   Creatinine, Ser 0.86 0.44 - 1.00 mg/dL   Calcium 8.7 (L) 8.9 - 10.3 mg/dL   GFR calc non Af Amer >60 >60 mL/min   GFR calc  Af Amer >60 >60 mL/min    Comment: (NOTE) The eGFR has been calculated using the CKD EPI equation. This calculation has not been validated in all clinical situations. eGFR's persistently <60 mL/min signify possible Chronic Kidney Disease.    Anion gap 11 5 - 15  Triglycerides     Status: Abnormal   Collection Time: 01/03/16  5:00 AM  Result Value Ref Range   Triglycerides 157 (H) <150 mg/dL    Imaging / Studies: No results found.  Medications / Allergies:  Scheduled Meds: . docusate sodium  100 mg Oral BID  . enoxaparin (LOVENOX) injection  30 mg Subcutaneous Q12H  . feeding supplement (ENSURE ENLIVE)  237 mL Oral BID BM  . fentaNYL (SUBLIMAZE) injection  400-600 mcg Intravenous Once  . fentaNYL (SUBLIMAZE) injection  400-600 mcg Intravenous Once  . midazolam  4 mg Intravenous Once  . mometasone-formoterol  2 puff Inhalation BID  . multivitamin with minerals  1 tablet Oral Daily  . naproxen  500 mg Oral BID WC  . polyethylene glycol  17 g Oral Daily  .  pregabalin  200 mg Oral BID  . traMADol  100 mg Oral 4 times per day  . vancomycin  750 mg Intravenous Q12H   Continuous Infusions:  PRN Meds:.albuterol, Gerhardt's butt cream, HYDROmorphone (DILAUDID) injection, [DISCONTINUED] ondansetron **OR** ondansetron (ZOFRAN) IV, oxyCODONE, sodium chloride flush  Antibiotics: Anti-infectives    Start     Dose/Rate Route Frequency Ordered Stop   01/02/16 1400  vancomycin (VANCOCIN) IVPB 750 mg/150 ml premix     750 mg 150 mL/hr over 60 Minutes Intravenous Every 12 hours 01/02/16 1245     12/30/15 2200  vancomycin (VANCOCIN) IVPB 1000 mg/200 mL premix  Status:  Discontinued     1,000 mg 200 mL/hr over 60 Minutes Intravenous Every 12 hours 12/30/15 1020 01/02/16 1245   12/29/15 1200  Ampicillin-Sulbactam (UNASYN) 3 g in sodium chloride 0.9 % 100 mL IVPB  Status:  Discontinued     3 g 100 mL/hr over 60 Minutes Intravenous Every 8 hours 12/29/15 1053 01/01/16 0813   12/28/15 1600   vancomycin (VANCOCIN) IVPB 1000 mg/200 mL premix  Status:  Discontinued     1,000 mg 200 mL/hr over 60 Minutes Intravenous Every 8 hours 12/28/15 1441 12/30/15 1016   12/27/15 0800  vancomycin (VANCOCIN) 1,250 mg in sodium chloride 0.9 % 250 mL IVPB  Status:  Discontinued     1,250 mg 166.7 mL/hr over 90 Minutes Intravenous Every 24 hours 12/26/15 2352 12/28/15 1441   12/27/15 0000  clindamycin (CLEOCIN) IVPB 900 mg  Status:  Discontinued     900 mg 100 mL/hr over 30 Minutes Intravenous Every 8 hours 12/26/15 2324 12/28/15 0909   12/27/15 0000  piperacillin-tazobactam (ZOSYN) IVPB 3.375 g  Status:  Discontinued     3.375 g 12.5 mL/hr over 240 Minutes Intravenous Every 8 hours 12/26/15 2352 12/29/15 1053   12/26/15 1815  vancomycin (VANCOCIN) IVPB 1000 mg/200 mL premix     1,000 mg 200 mL/hr over 60 Minutes Intravenous  Once 12/26/15 1806 12/26/15 2037   12/26/15 1815  piperacillin-tazobactam (ZOSYN) IVPB 3.375 g     3.375 g 100 mL/hr over 30 Minutes Intravenous  Once 12/26/15 1806 12/26/15 1938       Assessment/Plan: Sepsis POD#7 I&D left flank abscess---Dr. Ninfa Linden Wound- change vac today  ID- vanc with mrsa D#8 ?duration FEN --OxyIR, schedule tramadol, NSAID, dilaudid for dressing changes  VTE -- Lovenox Dispo -- Dressing changes  Erby Pian, ANP-BC [facility] Pager [phone](7A-4:30P) For consults and floor pages call [phone](7A-4:30P)  01/03/2016 8:03 AM

## 2016-01-03 NOTE — Care Management (Signed)
Home VAC has been approved , [facility] will hold for 14 days starting today , [facility] [phone]   Ronny FlurryHeather Elmore Hyslop RN BSN [phone]

## 2016-01-03 NOTE — Care Management Note (Signed)
Case Management Note  Patient Details  Name: Kathleen Frederick MRN: [id number] Date of Birth: [birth year]  Subjective/Objective:                    Action/Plan: Discussed discharge planning with patient .   Confirmed patient has no insurance .   Patient currently living with her two children at her mother's home and her mother's husband.  Address : [address], [place], [place] [address] , patient's cell phone number is [phone] .  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 Patient recently in jail because she was 10 minutes late for her court date ( patient blames her boyfriend for this) . Per patient, as a result  judge placed a high bail which she could not pay so she was sentenced to 31 days in jail. While CM was in patient's room she received a phone call, phone said it was her [employer] fast streamerparole officer. While patient incarcerated boyfriend did not pay bills so she lost her home so she moved in with her mother   Expected Discharge Date:                  Expected Discharge Plan:  Home w Home Health Services  In-House Referral:  Clinical Social Work  Discharge planning Services  CM Consult  Post Acute Care Choice:  Durable Medical Equipment, Home Health Choice offered to:  Patient  DME Arranged:  Vac DME Agency:  [facility]  HH Arranged:  RN HH Agency:  [facility]  Status of Service:  In process, will continue to follow  Medicare Important Message Given:    Date Medicare IM Given:    Medicare IM give by:    Date Additional Medicare IM Given:    Additional Medicare Important Message give by:     If discussed at Long Length of Stay Meetings, dates discussed:    Additional Comments:  Kathleen Frederick, Kathleen Maiello Marie, RN 01/03/2016, 11:29 AM

## 2016-01-03 NOTE — Consult Note (Addendum)
WOC wound consult note Reason for Consult: NPWT VAC dressing change with CCS team at the bedside to assess wound and IV pain meds given prior to the procedure Wound type:surgical full thickness post-op wound Wound appearance unchanged since previous consult Dressing procedure/placement/frequency: Removed 2 pieces white foam and one piece of black foam; applied one piece black sponge as directed by surgical team.  Pt tolerated poorly and stated the extra IV pain meds were not enough for this procedure. Sealed with drape and NPWT at 125mmHG obtained.There are some areas of reddish purple partial thickness linear wounds on the bottom flank area which appear to be related to previous medical adhesive related skin damage.  WOC will need to complete dressing changes Q M/W/F with extra IV pain meds given at the bedside until she is able to tolerate the process without this medication regime and can be discharged.  This consult took one hour to perform. Cammie Mcgeeawn Stoney Karczewski MSN, RN, CWOCN, [place]AP, CNS [phone]

## 2016-01-04 MED ORDER — METHOCARBAMOL 500 MG PO TABS
1000.0000 mg | ORAL_TABLET | Freq: Three times a day (TID) | ORAL | Status: DC | PRN
  Administered 2016-01-04 – 2016-01-05 (×3): 1000 mg via ORAL
  Filled 2016-01-04 (×3): qty 2

## 2016-01-04 MED ORDER — HYDROMORPHONE HCL 1 MG/ML IJ SOLN
1.0000 mg | Freq: Four times a day (QID) | INTRAMUSCULAR | Status: DC | PRN
  Administered 2016-01-04 – 2016-01-05 (×4): 1 mg via INTRAVENOUS
  Filled 2016-01-04 (×4): qty 1

## 2016-01-04 MED ORDER — HYDROMORPHONE HCL 1 MG/ML IJ SOLN: 1.0000 mg | Freq: Four times a day (QID) | INTRAMUSCULAR | Status: DC

## 2016-01-04 MED ORDER — ALPRAZOLAM 0.5 MG PO TABS
1.0000 mg | ORAL_TABLET | Freq: Two times a day (BID) | ORAL | Status: DC | PRN
  Administered 2016-01-04 – 2016-01-05 (×2): 1 mg via ORAL
  Filled 2016-01-04 (×2): qty 2

## 2016-01-04 MED ORDER — FUROSEMIDE 40 MG PO TABS
40.0000 mg | ORAL_TABLET | Freq: Every day | ORAL | Status: DC
  Administered 2016-01-04 – 2016-01-05 (×2): 40 mg via ORAL
  Filled 2016-01-04 (×2): qty 1

## 2016-01-04 MED ORDER — DOXYCYCLINE HYCLATE 100 MG PO TABS
100.0000 mg | ORAL_TABLET | Freq: Two times a day (BID) | ORAL | Status: DC
  Administered 2016-01-04 – 2016-01-05 (×3): 100 mg via ORAL
  Filled 2016-01-04 (×3): qty 1

## 2016-01-04 NOTE — Progress Notes (Signed)
Nutrition Follow-up  DOCUMENTATION CODES:   Morbid obesity  INTERVENTION:   -Continue MVI daily -Continue Ensure Enlive po BID, each supplement provides 350 kcal and 20 grams of protein  NUTRITION DIAGNOSIS:   Increased nutrient needs related to wound healing as evidenced by estimated needs.  Ongoing  GOAL:   Patient will meet greater than or equal to 90% of their needs  Progressing  MONITOR:   PO intake, Supplement acceptance, Labs, Weight trends, Skin, I & O's  REASON FOR ASSESSMENT:   Consult Enteral/tube feeding initiation and management  ASSESSMENT:   [age] Female who presented to the ED with complaints of left flank pain. She was found to be hypotensive. She has a history of iv drug abuse, hepatitis C and anxiety. Initial labs showed acute renal failure with Cr 2.36 and leukocytosis at 19.9. Blood cultures were drawn. She was given IVF and antibiotics, then taken to the OR urgently for debridement out of concern for necrotizing fasciitis. She was intubated and central line and a-line were placed. Intraoperatively she required intermittent pressor support. The surgeon noted necrotic fat, but did not feel that the underlying fascia or muscle were involved. Postoperatively she was admitted to 2S. She remains intubated.   Patient s/p procedure 4/9: INCISION, DRAINAGE AND SHARP DEBRIDEMENT OF LEFT FLANK ABSCESS  Pt receiving nursing care at time of visit.   Pt transferred from ICU to surgical floor on 01/01/16.   Pt remains with wound vac to lt flank; last change on 01/03/16. Per RNCM note, pt with approval for home vac.   Pt's intake has increased from last visit. Noted 50-100% meal completion. Per MAR, pt is accepting Ensure supplements. RD will continue supplements due to increased nutritional needs for wound healing.   Per surgical notes, potential d/c on 01/05/16 if able to tolerate dressing changes for PO pain medications.   Labs reviewed.   Diet Order:  Diet  regular Room service appropriate?: Yes; Fluid consistency:: Thin  Skin:  Wound (see comment) (wound vac lt flank)  Last BM:  01/03/16  Height:   Ht Readings from Last 1 Encounters:  12/26/15 5\' 6"  (1.676 m)    Weight:   Wt Readings from Last 1 Encounters:  01/03/16 251 lb 8 oz (114.08 kg)    Ideal Body Weight:  59 kg  BMI:  Body mass index is 40.61 kg/(m^2).  Estimated Nutritional Needs:   Kcal:  1900-2100  Protein:  >120 grams  Fluid:  >1.9 L  EDUCATION NEEDS:   Education needs addressed  Loreta Blouch A. Mayford KnifeWilliams, RD, LDN, CDE Pager: [phone] After hours Pager: [phone]

## 2016-01-04 NOTE — Care Management Note (Signed)
Case Management Note  Patient Details  Name: Kathleen Frederick MRN: [id number] Date of Birth: 10/07/1980  Subjective/Objective:                    Action/Plan:   Expected Discharge Date:                  Expected Discharge Plan:  Home w Home Health Services  In-House Referral:  Clinical Social Work  Discharge planning Services  CM Consult  Post Acute Care Choice:  Durable Medical Equipment, Home Health Choice offered to:  Patient  DME Arranged:  Vac DME Agency:  [facility]  HH Arranged:  RN HH Agency:  [facility]  Status of Service:  In process, will continue to follow  Medicare Important Message Given:    Date Medicare IM Given:    Medicare IM give by:    Date Additional Medicare IM Given:    Additional Medicare Important Message give by:     If discussed at Long Length of Stay Meetings, dates discussed:  01-04-16  UR updated   Additional Comments:  Kingsley PlanWile, Carlton Buskey Marie, RN 01/04/2016, 1:26 PM

## 2016-01-04 NOTE — Progress Notes (Signed)
[facility] Progress Note  9 Days Post-Op  Subjective: Pt doing well.  No N/V, tolerating diet.  Ambulating much better.  Pain well controlled.  She wants to try the dressing change tomorrow without any IV pain medications.    Objective: Vital signs in last 24 hours: Temp:  [97.7 F (36.5 C)-98.5 F (36.9 C)] 97.7 F (36.5 C) (04/18 0641) Pulse Rate:  [83-88] 88 (04/18 0641) Resp:  [18] 18 (04/18 0641) BP: (122-134)/(92-100) 122/92 mmHg (04/18 0641) SpO2:  [98 %-100 %] 100 % (04/18 0741) Weight:  [251 lb 8 oz (114.08 kg)] 251 lb 8 oz (114.08 kg) (04/17 1300) Last BM Date: 01/03/16  Intake/Output from previous day: 04/17 0701 - 04/18 0700 In: 1080 [P.O.:1080] Out: 1100 [Urine:1050; Drains:50] Intake/Output this shift:    PE: Gen:  Alert, NAD, pleasant Card:  RRR, no M/G/R heard Pulm:  CTA, no W/R/R Abd: Soft, NT/ND, +BS, wound vac on left flank - serosang drainage  Lab Results:  No results for input(s): WBC, HGB, HCT, PLT in the last 72 hours. BMET  Recent Labs  01/03/16 0500  NA 138  K 3.8  CL 100*  CO2 27  GLUCOSE 105*  BUN 6  CREATININE 0.86  CALCIUM 8.7*   PT/INR No results for input(s): LABPROT, INR in the last 72 hours. CMP     Component Value Date/Time   NA 138 01/03/2016 0500   K 3.8 01/03/2016 0500   CL 100* 01/03/2016 0500   CO2 27 01/03/2016 0500   GLUCOSE 105* 01/03/2016 0500   BUN 6 01/03/2016 0500   CREATININE 0.86 01/03/2016 0500   CALCIUM 8.7* 01/03/2016 0500   PROT 6.5 12/26/2015 1638   ALBUMIN 2.6* 12/26/2015 1638   AST 69* 12/26/2015 1638   ALT 42 12/26/2015 1638   ALKPHOS 93 12/26/2015 1638   BILITOT 1.1 12/26/2015 1638   GFRNONAA >60 01/03/2016 0500   GFRAA >60 01/03/2016 0500   Lipase  No results found for: LIPASE     Studies/Results: No results found.  Anti-infectives: Anti-infectives    Start     Dose/Rate Route Frequency Ordered Stop   01/02/16 1400  vancomycin (VANCOCIN) IVPB 750 mg/150 ml premix      750 mg 150 mL/hr over 60 Minutes Intravenous Every 12 hours 01/02/16 1245     12/30/15 2200  vancomycin (VANCOCIN) IVPB 1000 mg/200 mL premix  Status:  Discontinued     1,000 mg 200 mL/hr over 60 Minutes Intravenous Every 12 hours 12/30/15 1020 01/02/16 1245   12/29/15 1200  Ampicillin-Sulbactam (UNASYN) 3 g in sodium chloride 0.9 % 100 mL IVPB  Status:  Discontinued     3 g 100 mL/hr over 60 Minutes Intravenous Every 8 hours 12/29/15 1053 01/01/16 0813   12/28/15 1600  vancomycin (VANCOCIN) IVPB 1000 mg/200 mL premix  Status:  Discontinued     1,000 mg 200 mL/hr over 60 Minutes Intravenous Every 8 hours 12/28/15 1441 12/30/15 1016   12/27/15 0800  vancomycin (VANCOCIN) 1,250 mg in sodium chloride 0.9 % 250 mL IVPB  Status:  Discontinued     1,250 mg 166.7 mL/hr over 90 Minutes Intravenous Every 24 hours 12/26/15 2352 12/28/15 1441   12/27/15 0000  clindamycin (CLEOCIN) IVPB 900 mg  Status:  Discontinued     900 mg 100 mL/hr over 30 Minutes Intravenous Every 8 hours 12/26/15 2324 12/28/15 0909   12/27/15 0000  piperacillin-tazobactam (ZOSYN) IVPB 3.375 g  Status:  Discontinued     3.375 g  12.5 mL/hr over 240 Minutes Intravenous Every 8 hours 12/26/15 2352 12/29/15 1053   12/26/15 1815  vancomycin (VANCOCIN) IVPB 1000 mg/200 mL premix     1,000 mg 200 mL/hr over 60 Minutes Intravenous  Once 12/26/15 1806 12/26/15 2037   12/26/15 1815  piperacillin-tazobactam (ZOSYN) IVPB 3.375 g     3.375 g 100 mL/hr over 30 Minutes Intravenous  Once 12/26/15 1806 12/26/15 1938       Assessment/Plan Sepsis POD#8 I&D left flank abscess---Dr. Magnus Ivan Wound - change vac MWF ID- MRSA was on vanco for 8 Days, will switch to doxycycline Day #1/6 (total 14 day course) FEN --OxyIR, schedule tramadol, NSAID, robaxin.  Only orals for dressing change on Wednesday for discharge anticipation VTE -- Lovenox Dispo -- Dressing changes, hopefully home Wednesday if tolerating dressing changes with PO's  only    LOS: 9 days    Nonie Hoyer 01/04/2016, 8:58 AM Pager: [phone]  (7am - 4:30pm M-F; 7am - 11:30am Sa/Su)

## 2016-01-05 LAB — BASIC METABOLIC PANEL
ANION GAP: 10 (ref 5–15)
BUN: 10 mg/dL (ref 6–20)
CHLORIDE: 103 mmol/L (ref 101–111)
CO2: 28 mmol/L (ref 22–32)
Calcium: 8.8 mg/dL — ABNORMAL LOW (ref 8.9–10.3)
Creatinine, Ser: 0.91 mg/dL (ref 0.44–1.00)
GFR calc Af Amer: >60 mL/min (ref >60)
GLUCOSE: 86 mg/dL (ref 65–99)
POTASSIUM: 3.7 mmol/L (ref 3.5–5.1)
SODIUM: 141 mmol/L (ref 135–145)

## 2016-01-05 MED ORDER — DOXYCYCLINE HYCLATE 100 MG PO TABS: 100.0000 mg | ORAL_TABLET | Freq: Two times a day (BID) | ORAL | Status: DC

## 2016-01-05 MED ORDER — OXYCODONE HCL 10 MG PO TABS: 10.0000 mg | ORAL_TABLET | Freq: Four times a day (QID) | ORAL | Status: AC | PRN

## 2016-01-05 MED ORDER — POLYETHYLENE GLYCOL 3350 17 G PO PACK: 17.0000 g | PACK | Freq: Every day | ORAL | Status: AC

## 2016-01-05 MED ORDER — METHOCARBAMOL 500 MG PO TABS: 1000.0000 mg | ORAL_TABLET | Freq: Three times a day (TID) | ORAL | Status: AC | PRN

## 2016-01-05 MED ORDER — NAPROXEN 500 MG PO TABS: 500.0000 mg | ORAL_TABLET | Freq: Two times a day (BID) | ORAL | Status: AC

## 2016-01-05 NOTE — Discharge Instructions (Signed)
Wound vac changes Monday Wednesday and fridays   [facility]   [address]  [place], [place] [address]  [phone]  [facility]  [address]  [place] , [place] [address]  [phone]

## 2016-01-05 NOTE — Progress Notes (Signed)
Arrived to discontinue R IJ CVC. Patient requested 20 minutes to go to restroom and get ready. Primary RN aware.

## 2016-01-05 NOTE — Discharge Summary (Signed)
Physician Discharge Summary  Kathleen Frederick KGM:[id number] DOB: 29-Jan-1981 DOA: 12/26/2015  PCP: No primary care provider on file.  Consultation: CCM and WOC  Admit date: 12/26/2015 Discharge date: 01/05/2016  Recommendations for Outpatient Follow-up:   Follow-up Information    Follow up with [facility] A, MD In 2 weeks.   Specialty:  General Surgery   Why:  For wound re-check   Contact information:   [address] [place] [address] [phone]      Discharge Diagnoses:  1. Septic shock 2. Left flank abscess 3. Acute renal failure 4. Hx hepatitis C 5. anemia   Surgical Procedure: I&D of left flank abscess---Dr. Magnus Ivan 12/26/15  Discharge Condition: stable Disposition: home  Diet recommendation: regular  Filed Weights   01/01/16 1253 01/03/16 1300 01/04/16 1100  Weight: 122.471 kg (270 lb) 114.08 kg (251 lb 8 oz) 115 kg (253 lb 8.5 oz)     Filed Vitals:   01/04/16 2138 01/05/16 0552  BP: 127/86 100/67  Pulse: 89 89  Temp: 97.5 F (36.4 C) 97.5 F (36.4 C)  Resp: 18 16     Hospital Course:  Kathleen Frederick is a 35 year old female with a history of hepatitis C, heroin and narcotic abuse and nicotine dependence presented to [facility] with left flank pain.  She was found to have hypotension, renal failure and a large left flank abscess.  She underwent the procedure listed above.  She was then transferred to the ICU on the ventilator and kept sedation in anticipation of returning to OR for further debridement.  Critical care medicine was consulted for ventilator management and management of septic shock.  She required levo for pressure support but was weaned off as infection improved.  She did not require further OR debridement. Once cellulitis resolved, negative pressure wound therapy was placed. Cultures yielded MRSA, kept on Vanc and then transitioned to doxycycline for a total of 14 days of therapy.  We had a difficult time controlling her pain and first 2  times required conscious sedation for the VAC changes.  She was able to be transitioned to dilaudid 8mg  with VAC changes and then oxycodone 20mg  plus robaxin.  The patient admits to a history of opana use followed by heroin and then methadone which she no longer takes.  Of course, this made pain management rather difficult.  On discharge, we discussed potential abuse and addiction, but also realize she needs adequate pain control during VAC Changes.  The wound has tremendously healed, initially requiring 2 large black sponges down to 1 black sponge.  She understands as it continues to heal, she will need to wean down the oxycodone.  Further refills will be at the discretion of Dr. Magnus Ivan.  We discussed warning signs that warrant immediate attention.  Home health was arranged for MWF VAC changes.  She knows to follow up in 2 weeks.   Physical Exam: General: NAD.  VSS.  Afebrile. Skin: wound is clean, good granulation tissue.  23x4x4cm.  VAC was changed.  Discharge Instructions     Medication List    TAKE these medications        albuterol 108 (90 Base) MCG/ACT inhaler  Commonly known as:  PROVENTIL HFA;VENTOLIN HFA  Inhale 2 puffs into the lungs every 4 (four) hours as needed for wheezing or shortness of breath.     doxycycline 100 MG tablet  Commonly known as:  VIBRA-TABS  Take 1 tablet (100 mg total) by mouth every 12 (twelve) hours.  LASIX 40 MG tablet  Generic drug:  furosemide  Take 40 mg by mouth daily.     methocarbamol 500 MG tablet  Commonly known as:  ROBAXIN  Take 2 tablets (1,000 mg total) by mouth every 8 (eight) hours as needed for muscle spasms (or pain).     naproxen 500 MG tablet  Commonly known as:  NAPROSYN  Take 1 tablet (500 mg total) by mouth 2 (two) times daily with a meal.     Oxycodone HCl 10 MG Tabs  Take 1-2 tablets (10-20 mg total) by mouth every 6 (six) hours as needed (10mg  for mild pain, 15mg  for moderate pain, 20mg  for severe pain).      polyethylene glycol packet  Commonly known as:  MIRALAX / GLYCOLAX  Take 17 g by mouth daily.     pregabalin 200 MG capsule  Commonly known as:  LYRICA  Take 200 mg by mouth 2 (two) times daily.     promethazine 25 MG tablet  Commonly known as:  PHENERGAN  Take 25 mg by mouth every 6 (six) hours as needed for nausea or vomiting.     SYMBICORT 160-4.5 MCG/ACT inhaler  Generic drug:  budesonide-formoterol  Inhale 2 puffs into the lungs 2 (two) times daily.     traMADol 50 MG tablet  Commonly known as:  ULTRAM  Take 50 mg by mouth every 6 (six) hours as needed.           Follow-up Information    [facility].   Specialty:  General Surgery   Why:  For wound re-check   Contact information:   [address] [place] [address] [phone]        The results of significant diagnostics from this hospitalization (including imaging, microbiology, ancillary and laboratory) are listed below for reference.    Significant Diagnostic Studies: Dg Chest Port 1 View  12/27/2015  CLINICAL DATA:  Acute respiratory failure. EXAM: PORTABLE CHEST 1 VIEW COMPARISON:  12/26/2015 FINDINGS: Endotracheal tube is 2 cm above the carina. Right central line tip in the upper right atrium. Very low lung volumes with bibasilar atelectasis and accentuation of heart size. NG tube is in the stomach. No visible significant effusions. IMPRESSION: Very low lung volumes with bibasilar atelectasis. Electronically Signed   By: Charlett NoseKevin  Dover M.D.   On: 12/27/2015 10:30   Dg Chest Port 1 View  12/26/2015  CLINICAL DATA:  Central line placement EXAM: PORTABLE CHEST 1 VIEW COMPARISON:  None. FINDINGS: Endotracheal tube extends into the right mainstem bronchus and should be pulled back 2-3 cm. The nasogastric tube extends well into the stomach. There is a right jugular central line which extends into the low SVC. There is no pneumothorax. The lungs are grossly clear. No large  effusion. IMPRESSION: Right mainstem intubation. Recommend pulling the ET tube back 2-3 cm. Satisfactory NG tube position. Critical Value/emergent results were called by telephone at the time of interpretation on 12/26/2015 at 11:13 pm to Dr. Andria MeuseStevens, who verbally acknowledged these results. Electronically Signed   By: Ellery Plunkaniel R Mitchell M.D.   On: 12/26/2015 23:16    Microbiology: Recent Results (from the past 240 hour(s))  Blood Culture (routine x 2)     Status: None   Collection Time: 12/26/15 12:50 PM  Result Value Ref Range Status   Specimen Description BLOOD RIGHT ANTECUBITAL  Final   Special Requests IN PEDIATRIC BOTTLE 4CC  Final   Culture NO  GROWTH 5 DAYS  Final   Report Status 12/31/2015 FINAL  Final  Blood Culture (routine x 2)     Status: None   Collection Time: 12/26/15  5:55 PM  Result Value Ref Range Status   Specimen Description BLOOD RIGHT FOREARM  Final   Special Requests BOTTLES DRAWN AEROBIC AND ANAEROBIC 5CC  Final   Culture NO GROWTH 5 DAYS  Final   Report Status 12/31/2015 FINAL  Final  Urine culture     Status: None   Collection Time: 12/26/15  8:45 PM  Result Value Ref Range Status   Specimen Description URINE, CATHETERIZED  Final   Special Requests NONE  Final   Culture NO GROWTH 2 DAYS  Final   Report Status 12/28/2015 FINAL  Final  MRSA PCR Screening     Status: Abnormal   Collection Time: 12/26/15 11:16 PM  Result Value Ref Range Status   MRSA by PCR POSITIVE (A) NEGATIVE Final    Comment:        The GeneXpert MRSA Assay (FDA approved for NASAL specimens only), is one component of a comprehensive MRSA colonization surveillance program. It is not intended to diagnose MRSA infection nor to guide or monitor treatment for MRSA infections. RESULT CALLED TO, READ BACK BY AND VERIFIED WITH: PARRIN,J RN [phone] AT 0150 SKEEN,P   Culture, routine-abscess     Status: None   Collection Time: 12/26/15 11:20 PM  Result Value Ref Range Status   Specimen  Description ABSCESS  Final   Special Requests LEFT FLANK PATIENT ON FOLLOWING VANCOMYCIN ZOSYN  Final   Gram Stain   Final    ABUNDANT WBC PRESENT,BOTH PMN AND MONONUCLEAR NO SQUAMOUS EPITHELIAL CELLS SEEN FEW GRAM POSITIVE COCCI IN PAIRS IN CLUSTERS Performed at [facility]    Culture   Final    ABUNDANT METHICILLIN RESISTANT STAPHYLOCOCCUS AUREUS 12 Note: RIFAMPIN AND GENTAMICIN SHOULD NOT BE USED AS SINGLE DRUGS FOR TREATMENT OF STAPH INFECTIONS. CRITICAL RESULT CALLED TO, READ BACK BY AND VERIFIED WITH: JAMIE 2S 4 17 @ 902AM Performed at [facility]    Report Status 12/29/2015 FINAL  Final   Organism ID, Bacteria METHICILLIN RESISTANT STAPHYLOCOCCUS AUREUS  Final      Susceptibility   Methicillin resistant staphylococcus aureus - MIC*    CLINDAMYCIN >=8 RESISTANT Resistant     ERYTHROMYCIN >=8 RESISTANT Resistant     GENTAMICIN <=0.5 SENSITIVE Sensitive     LEVOFLOXACIN 4 INTERMEDIATE Intermediate     OXACILLIN >=4 RESISTANT Resistant     RIFAMPIN <=0.5 SENSITIVE Sensitive     TRIMETH/SULFA <=10 SENSITIVE Sensitive     VANCOMYCIN <=0.5 SENSITIVE Sensitive     TETRACYCLINE <=1 SENSITIVE Sensitive     * ABUNDANT METHICILLIN RESISTANT STAPHYLOCOCCUS AUREUS  Anaerobic culture     Status: None   Collection Time: 12/26/15 11:20 PM  Result Value Ref Range Status   Specimen Description ABSCESS  Final   Special Requests LEFT FLANK PATIENT ON FOLLOWING VANCOMYCIN ZOSYN  Final   Gram Stain   Final    ABUNDANT WBC PRESENT,BOTH PMN AND MONONUCLEAR NO SQUAMOUS EPITHELIAL CELLS SEEN FEW GRAM POSITIVE COCCI IN PAIRS IN CLUSTERS Performed at [facility]    Culture   Final    NO ANAEROBES ISOLATED Performed at [facility]    Report Status 01/01/2016 FINAL  Final     Labs: Basic Metabolic Panel:  Recent Labs Lab 12/30/15 0405 12/31/15 0445 01/03/16 0500 01/05/16 0430  NA 141 139  138 141  K 3.2* 4.0 3.8 3.7  CL 106 105 100* 103  CO2 GLUCOSE 128* 115* 105* 86  BUN <5* <5* 6 10  CREATININE 0.72 0.63 0.86 0.91  CALCIUM 7.7* 8.0* 8.7* 8.8*  MG 1.6*  --   --   --    Liver Function Tests: No results for input(s): AST, ALT, ALKPHOS, BILITOT, PROT, ALBUMIN in the last 168 hours. No results for input(s): LIPASE, AMYLASE in the last 168 hours. No results for input(s): AMMONIA in the last 168 hours. CBC:  Recent Labs Lab 12/31/15 0445  WBC 7.1  HGB 8.9*  HCT 27.9*  MCV 84.5  PLT 253   Cardiac Enzymes: No results for input(s): CKTOTAL, CKMB, CKMBINDEX, TROPONINI in the last 168 hours. BNP: BNP (last 3 results)  Recent Labs  12/26/15 2351  BNP 46.8    ProBNP (last 3 results) No results for input(s): PROBNP in the last 8760 hours.  CBG:  Recent Labs Lab 12/31/15 1638 12/31/15 2036 01/01/16 01/01/16 0354 01/01/16 0914  GLUCAP 90 98 83 90 88    Active Problems:   Septic shock (HCC)   Abscess of flank   Signed:  Anjelita Sheahan, ANP-BC

## 2016-01-05 NOTE — Care Management (Signed)
Spoke with patient  At bedside and her mother Arnette Schaumannenny Jacobs via phone , confirmed with Ms Christella HartiganJacobs patient can stay with her starting tomorrow morning 01-06-16 . Patient and boyfriend state they have a hotel room tonight. Darl PikesSusan with [facility] aware.  Ronny FlurryHeather Latandra Loureiro RN BSN [phone]

## 2016-01-05 NOTE — Consult Note (Addendum)
WOC wound consult note Reason for Consult: NPWT VAC dressing change to left flank with CCS team at the bedside to assess wound  Wound type:surgical full thickness post-op wound Wound appearance unchanged since previous consult; beefy red 23X4X4cm Mod amt dark red drainage in the cannister, no odor Dressing procedure/placement/frequency: Applied one piece black sponge. Pt tolerated with po meds given prior to procedure and had mod amt pain. Sealed with drape and NPWT at 125mmHG obtained.There are some areas of reddish purple partial thickness linear wounds on the bottom flank area which appear to be related to previous medical adhesive related skin damage which are dry and healing. Pt plans to discharge home soon according to surgical team. Cammie Mcgeeawn Arlethia Basso MSN, RN, CWOCN, CWCN-AP, CNS [phone]

## 2016-01-05 NOTE — Care Management Note (Addendum)
Case Management Note  Patient Details  Name: Kathleen Frederick MRN: [id number] Date of Birth: 04/10/1981  Subjective/Objective:                    Action/Plan:  [facility]   [address]  [place], [place] [address]  [phone]  [facility]  [address]  [place] , [place] [address]  [phone]    MATCH letter given and explained   [facility] VAC will be delivered today to patient's hospital room within the next 4 to 5 hours ( [facility]Alexis [facility] Darl PikesSusan with [facility] aware [facility] being [facility] today .  Expected Discharge Date:                  Expected Discharge Plan:  Home w Home Health Services  In-House Referral:  Clinical Social Work  Discharge planning Services  CM Consult  Post Acute Care Choice:  Durable Medical Equipment, Home Health Choice offered to:  Patient  DME Arranged:  Vac DME Agency:  [facility]  HH Arranged:  RN HH Agency:  [facility]  Status of Service:  Completed, signed off  Medicare Important Message Given:    Date Medicare IM Given:    Medicare IM give by:    Date Additional Medicare IM Given:    Additional Medicare Important Message give by:     If discussed at Long Length of Stay Meetings, dates discussed:    Additional Comments:  Kingsley PlanWile, Heidi Maclin Marie, RN 01/05/2016, 9:31 AM

## 2016-01-05 NOTE — Progress Notes (Signed)
Discharge instructions reviewed with pt and prescriptions given.  Pt verbalized understanding and had no questions.  Pt sent home with supplies and instructions for a wet to dry dressing change if necessary.  Pt discharged in stable condition via wheelchair with friend.  Hector ShadeMoss, Mahrosh Donnell ClaremontLindsay

## 2016-08-26 IMAGING — CR DG CHEST 1V PORT
1 series · 1 of 1 positions shown · non-contrast
Comparison: 12/26/2015

CLINICAL DATA: Acute respiratory failure.

EXAM:
PORTABLE CHEST 1 VIEW

[AP]
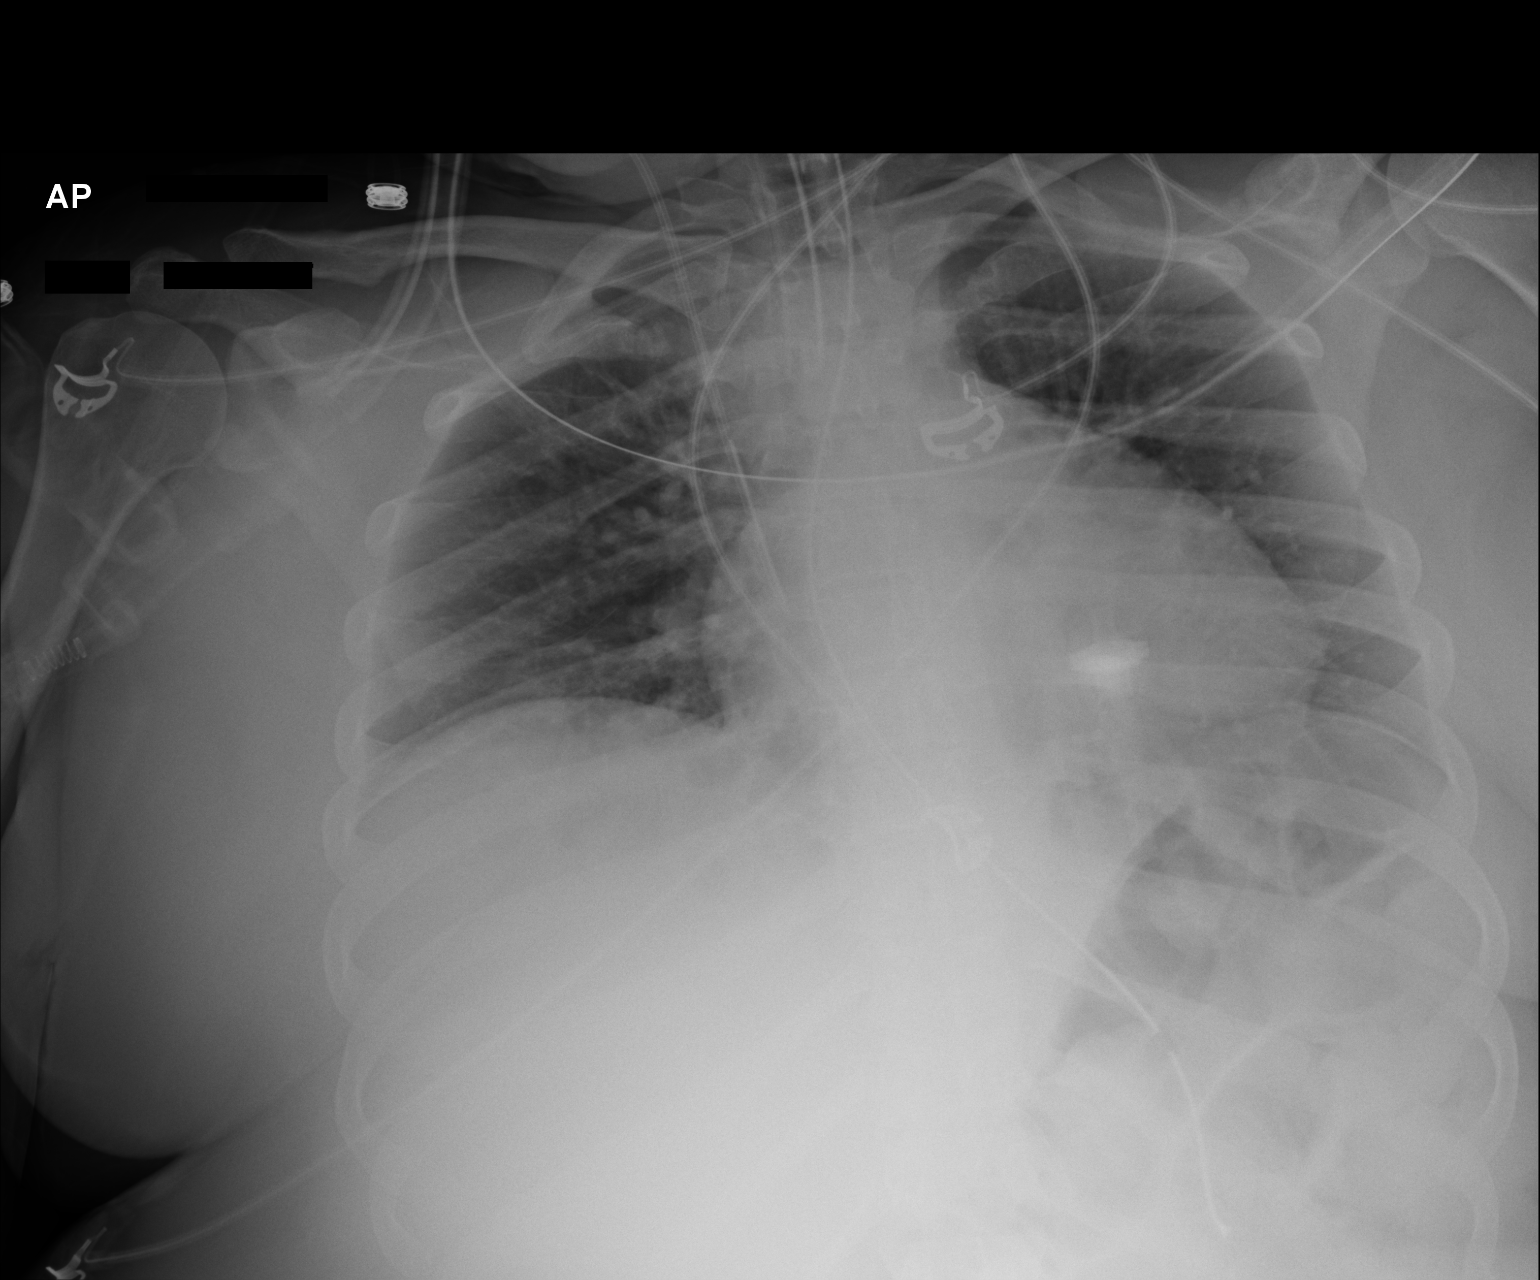

[1 of 1 positions shown; findings below may reference images not displayed]

FINDINGS: Endotracheal tube is 2 cm above the carina. Right central line tip
in the upper right atrium. Very low lung volumes with bibasilar
atelectasis and accentuation of heart size. NG tube is in the
stomach. No visible significant effusions.
IMPRESSION: Very low lung volumes with bibasilar atelectasis.

## 2019-03-17 ENCOUNTER — Other Ambulatory Visit: Payer: Self-pay

## 2019-03-17 DIAGNOSIS — Z5321 Procedure and treatment not carried out due to patient leaving prior to being seen by health care provider: Secondary | ICD-10-CM | POA: Insufficient documentation

## 2019-03-18 ENCOUNTER — Other Ambulatory Visit: Payer: Self-pay

## 2019-03-18 ENCOUNTER — Encounter (HOSPITAL_COMMUNITY): Payer: Self-pay | Admitting: *Deleted

## 2019-03-18 ENCOUNTER — Emergency Department (HOSPITAL_COMMUNITY)
Admission: EM | Admit: 2019-03-18 | Discharge: 2019-03-18 | Discharge status: Against Medical Advice | Payer: Self-pay | Attending: Emergency Medicine | Admitting: Emergency Medicine

## 2019-03-18 NOTE — ED Triage Notes (Signed)
Pt reports left anterior thigh abscess for 2 days, mild drainage. Large area of redness surrounding abscess. Denies fevers.

## 2019-03-18 NOTE — ED Notes (Signed)
Patient decided to leave, advised patient to stay.  Patient seen walking out with steady gait.

## 2019-04-26 ENCOUNTER — Other Ambulatory Visit: Payer: Self-pay

## 2019-04-26 ENCOUNTER — Emergency Department (HOSPITAL_COMMUNITY)
Admission: EM | Admit: 2019-04-26 | Discharge: 2019-04-27 | Disposition: A | Discharge status: Home / Self Care | Payer: Self-pay | Attending: Emergency Medicine | Admitting: Emergency Medicine

## 2019-04-26 ENCOUNTER — Encounter (HOSPITAL_COMMUNITY): Payer: Self-pay | Admitting: Emergency Medicine

## 2019-04-26 DIAGNOSIS — M4624 Osteomyelitis of vertebra, thoracic region: Secondary | ICD-10-CM | POA: Insufficient documentation

## 2019-04-26 DIAGNOSIS — Z79899 Other long term (current) drug therapy: Secondary | ICD-10-CM | POA: Insufficient documentation

## 2019-04-26 DIAGNOSIS — F1990 Other psychoactive substance use, unspecified, uncomplicated: Secondary | ICD-10-CM

## 2019-04-26 DIAGNOSIS — Z87898 Personal history of other specified conditions: Secondary | ICD-10-CM | POA: Insufficient documentation

## 2019-04-26 DIAGNOSIS — F17200 Nicotine dependence, unspecified, uncomplicated: Secondary | ICD-10-CM | POA: Insufficient documentation

## 2019-04-26 DIAGNOSIS — M4644 Discitis, unspecified, thoracic region: Secondary | ICD-10-CM | POA: Insufficient documentation

## 2019-04-26 LAB — BASIC METABOLIC PANEL
Anion gap: 14 (ref 5–15)
BUN: 15 mg/dL (ref 6–20)
CO2: 20 mmol/L — ABNORMAL LOW (ref 22–32)
Calcium: 8.6 mg/dL — ABNORMAL LOW (ref 8.9–10.3)
Chloride: 104 mmol/L (ref 98–111)
Creatinine, Ser: 0.97 mg/dL (ref 0.44–1.00)
GFR calc Af Amer: >60 mL/min (ref >60)
GFR calc non Af Amer: >60 mL/min (ref >60)
Glucose, Bld: 80 mg/dL (ref 70–99)
Potassium: 5 mmol/L (ref 3.5–5.1)
Sodium: 138 mmol/L (ref 135–145)

## 2019-04-26 LAB — CBC WITH DIFFERENTIAL/PLATELET
Abs Immature Granulocytes: 0.03 10*3/uL (ref 0.00–0.07)
Basophils Absolute: 0.1 10*3/uL (ref 0.0–0.1)
Basophils Relative: 1 %
Eosinophils Absolute: 0.3 10*3/uL (ref 0.0–0.5)
Eosinophils Relative: 5 %
HCT: 31.2 % — ABNORMAL LOW (ref 36.0–46.0)
Hemoglobin: 9 g/dL — ABNORMAL LOW (ref 12.0–15.0)
Immature Granulocytes: 1 %
Lymphocytes Relative: 39 %
Lymphs Abs: 2.5 10*3/uL (ref 0.7–4.0)
MCH: 25.3 pg — ABNORMAL LOW (ref 26.0–34.0)
MCHC: 28.8 g/dL — ABNORMAL LOW (ref 30.0–36.0)
MCV: 87.6 fL (ref 80.0–100.0)
Monocytes Absolute: 0.7 10*3/uL (ref 0.1–1.0)
Monocytes Relative: 11 %
Neutro Abs: 2.7 10*3/uL (ref 1.7–7.7)
Neutrophils Relative %: 43 %
Platelets: 403 10*3/uL — ABNORMAL HIGH (ref 150–400)
RBC: 3.56 MIL/uL — ABNORMAL LOW (ref 3.87–5.11)
RDW: 17.3 % — ABNORMAL HIGH (ref 11.5–15.5)
WBC: 6.3 10*3/uL (ref 4.0–10.5)
nRBC: 0 % (ref 0.0–0.2)

## 2019-04-26 MED ORDER — ENOXAPARIN SODIUM 40 MG/0.4ML ~~LOC~~ SOLN
40.00 | SUBCUTANEOUS | Status: DC
Start: 2019-04-25 — End: 2019-04-26

## 2019-04-26 MED ORDER — ONDANSETRON HCL 4 MG/2ML IJ SOLN
4.00 | INTRAMUSCULAR | Status: DC
Start: ? — End: 2019-04-26

## 2019-04-26 MED ORDER — ALPRAZOLAM 0.5 MG PO TABS
1.00 | ORAL_TABLET | ORAL | Status: DC
Start: ? — End: 2019-04-26

## 2019-04-26 MED ORDER — TRAZODONE HCL 50 MG PO TABS
50.0000 mg | ORAL_TABLET | Freq: Every day | ORAL | Status: DC
  Administered 2019-04-26: 50 mg via ORAL
  Filled 2019-04-26: qty 1

## 2019-04-26 MED ORDER — OXYCODONE HCL 5 MG PO TABS
10.0000 mg | ORAL_TABLET | Freq: Four times a day (QID) | ORAL | Status: DC | PRN
  Administered 2019-04-27: 20 mg via ORAL
  Filled 2019-04-26: qty 4

## 2019-04-26 MED ORDER — GENERIC EXTERNAL MEDICATION
300.00 | Status: DC
Start: 2019-04-25 — End: 2019-04-26

## 2019-04-26 MED ORDER — GABAPENTIN 300 MG PO CAPS
600.0000 mg | ORAL_CAPSULE | Freq: Three times a day (TID) | ORAL | Status: DC
  Administered 2019-04-26 – 2019-04-27 (×2): 600 mg via ORAL
  Filled 2019-04-26 (×2): qty 2

## 2019-04-26 MED ORDER — METOPROLOL TARTRATE 25 MG PO TABS
25.00 | ORAL_TABLET | ORAL | Status: DC
Start: 2019-04-25 — End: 2019-04-26

## 2019-04-26 MED ORDER — TRAZODONE HCL 50 MG PO TABS
50.00 | ORAL_TABLET | ORAL | Status: DC
Start: 2019-04-25 — End: 2019-04-26

## 2019-04-26 MED ORDER — BUTALBITAL-APAP-CAFFEINE 50-325-40 MG PO TABS
2.00 | ORAL_TABLET | ORAL | Status: DC
Start: ? — End: 2019-04-26

## 2019-04-26 MED ORDER — DOCUSATE SODIUM 100 MG PO CAPS
100.00 | ORAL_CAPSULE | ORAL | Status: DC
Start: 2019-04-25 — End: 2019-04-26

## 2019-04-26 MED ORDER — LACTULOSE 10 GM/15ML PO SOLN
30.00 | ORAL | Status: DC
Start: 2019-04-26 — End: 2019-04-26

## 2019-04-26 MED ORDER — FAMOTIDINE 20 MG PO TABS
20.00 | ORAL_TABLET | ORAL | Status: DC
Start: 2019-04-25 — End: 2019-04-26

## 2019-04-26 MED ORDER — SODIUM CHLORIDE 0.9 % IV BOLUS
500.0000 mL | Freq: Once | INTRAVENOUS | Status: AC
  Administered 2019-04-26: 500 mL via INTRAVENOUS

## 2019-04-26 MED ORDER — HEPARIN SODIUM LOCK FLUSH 100 UNIT/ML IV SOLN
300.00 | INTRAVENOUS | Status: DC
Start: 2019-04-25 — End: 2019-04-26

## 2019-04-26 MED ORDER — BISACODYL 5 MG PO TBEC
10.00 | DELAYED_RELEASE_TABLET | ORAL | Status: DC
Start: ? — End: 2019-04-26

## 2019-04-26 MED ORDER — GABAPENTIN 300 MG PO CAPS
600.00 | ORAL_CAPSULE | ORAL | Status: DC
Start: 2019-04-25 — End: 2019-04-26

## 2019-04-26 MED ORDER — METHADONE HCL 10 MG PO TABS
50.00 | ORAL_TABLET | ORAL | Status: DC
Start: 2019-04-26 — End: 2019-04-26

## 2019-04-26 MED ORDER — LIDOCAINE 4 % EX PTCH
1.00 | MEDICATED_PATCH | CUTANEOUS | Status: DC
Start: 2019-04-26 — End: 2019-04-26

## 2019-04-26 MED ORDER — SODIUM CHLORIDE 1 G PO TABS
1000.00 | ORAL_TABLET | ORAL | Status: DC
Start: 2019-04-25 — End: 2019-04-26

## 2019-04-26 MED ORDER — GENERIC EXTERNAL MEDICATION
1.00 | Status: DC
Start: 2019-04-25 — End: 2019-04-26

## 2019-04-26 MED ORDER — QUETIAPINE FUMARATE 300 MG PO TABS
300.0000 mg | ORAL_TABLET | Freq: Two times a day (BID) | ORAL | Status: DC
  Administered 2019-04-26 – 2019-04-27 (×2): 300 mg via ORAL
  Filled 2019-04-26 (×3): qty 1

## 2019-04-26 MED ORDER — HEPARIN SODIUM LOCK FLUSH 100 UNIT/ML IV SOLN
300.00 | INTRAVENOUS | Status: DC
Start: ? — End: 2019-04-26

## 2019-04-26 MED ORDER — VANCOMYCIN HCL IN DEXTROSE 1-5 GM/200ML-% IV SOLN
1000.0000 mg | Freq: Two times a day (BID) | INTRAVENOUS | Status: DC
  Filled 2019-04-26: qty 200

## 2019-04-26 MED ORDER — HYDROMORPHONE HCL 1 MG/ML IJ SOLN
0.50 | INTRAMUSCULAR | Status: DC
Start: ? — End: 2019-04-26

## 2019-04-26 MED ORDER — METHADONE HCL 10 MG PO TABS
20.00 | ORAL_TABLET | ORAL | Status: DC
Start: 2019-04-25 — End: 2019-04-26

## 2019-04-26 MED ORDER — POLYETHYLENE GLYCOL 3350 17 G PO PACK
17.00 | PACK | ORAL | Status: DC
Start: 2019-04-26 — End: 2019-04-26

## 2019-04-26 MED ORDER — VANCOMYCIN HCL IN DEXTROSE 1-5 GM/200ML-% IV SOLN
1000.0000 mg | Freq: Once | INTRAVENOUS | Status: AC
  Administered 2019-04-26: 18:00:00 1000 mg via INTRAVENOUS
  Filled 2019-04-26: qty 200

## 2019-04-26 MED ORDER — OXYCODONE HCL 5 MG PO TABS
20.00 | ORAL_TABLET | ORAL | Status: DC
Start: ? — End: 2019-04-26

## 2019-04-26 MED ORDER — DOCUSATE SODIUM 100 MG PO CAPS
100.0000 mg | ORAL_CAPSULE | Freq: Every day | ORAL | Status: DC
  Administered 2019-04-27: 100 mg via ORAL
  Filled 2019-04-26: qty 1

## 2019-04-26 MED ORDER — HYDROXYZINE HCL 25 MG PO TABS
25.00 | ORAL_TABLET | ORAL | Status: DC
Start: ? — End: 2019-04-26

## 2019-04-26 MED ORDER — OXYCODONE HCL 10 MG PO TABS: 10.0000 mg | ORAL_TABLET | Freq: Four times a day (QID) | ORAL | Status: DC | PRN

## 2019-04-26 MED ORDER — ACETAMINOPHEN 325 MG PO TABS
650.00 | ORAL_TABLET | ORAL | Status: DC
Start: ? — End: 2019-04-26

## 2019-04-26 NOTE — ED Notes (Signed)
Pt sleeping. 

## 2019-04-26 NOTE — ED Notes (Signed)
Pt seems very "drowsy" at time or 'nodding off' during conversation. Pt states she doesn't have insurance and the SNF she was sent to is horrible and they are mean and they will not give her the antibiotics needed to have the surgery on her  back

## 2019-04-26 NOTE — ED Triage Notes (Signed)
Pt reports she has an abscess to T11-T12, she reports being discharged from [facility]. She reports she was sent to a nursing home and did not recieve her antibiotics. Pt reports she is here to receive her antibiotics.

## 2019-04-26 NOTE — ED Notes (Signed)
Pt whining about somebody moving her things ???  She was at the [place] home  And she did not like it there.  Insisting on getting on the bedside commode herself

## 2019-04-26 NOTE — ED Provider Notes (Signed)
[facility] EMERGENCY DEPARTMENT Provider Note   CSN: [id number] Arrival date & time: 04/26/19  1621     History   Chief Complaint Chief Complaint  Patient presents with  . Post-op Problem    HPI Kathleen Frederick is a 38 y.o. female.     Patient with history of IV drug use, recent admission at [facility] from 7/13 to 04/25/2019 and diagnosed with discitis and thoracic vertebral osteomyelitis, currently with PICC line and on twice daily vancomycin for the next several weeks, discharged from [facility] yesterday and admitted to [place] [facility] --presents to the emergency department after leaving [facility] from her skilled nursing facility today.  Patient states that the staff there were "mean to me" and also stated that they would not give her her antibiotics and give her her meals.  She decided come back to the hospital because she does not want to be at that skilled nursing facility any longer.  I corroborated this history with patient's RN at [facility] who assumed care at [place] confirmed that she received her a.m. dose of vancomycin.  Patient did inquire about receiving IV Dilaudid at the nursing facility this morning and was refused.  She decided that she did not want to be there any more and left -- presenting here.  Patient has continued pain but no reported fevers, chest pain, shortness of breath, abdominal pain, nausea, vomiting, diarrhea.  She is currently maintained on oxycodone and methadone as well as Xanax.     Past Medical History:  Diagnosis Date  . Anxiety   . Hepatitis C     Patient Active Problem List   Diagnosis Date Noted  . Septic shock ([place]) 12/26/2015  . Abscess of flank 12/26/2015    Past Surgical History:  Procedure Laterality Date  . INCISION AND DRAINAGE ABSCESS Left 12/26/2015   Procedure: INCISION AND DRAINAGE ABSCESS;  Surgeon: Coralie Keens, MD;  Location: [facility];  Service: General;  Laterality: Left;     OB History   No obstetric history on file.      Home Medications    Prior to Admission medications   Medication Sig Start Date End Date Taking? Authorizing Provider  ALPRAZolam Duanne Moron) 1 MG tablet Take 1 mg by mouth daily. 04/25/19 05/25/19 Yes [provider]  famotidine (PEPCID) 20 MG tablet Take 20 mg by mouth 2 (two) times daily. 04/25/19  Yes [provider]  gabapentin (NEURONTIN) 300 MG capsule Take 600 mg by mouth 3 (three) times daily. 07/26/15  Yes [provider]  ibuprofen (ADVIL) 600 MG tablet Take 600 mg by mouth every 6 (six) hours as needed for pain. 11/21/16  Yes [provider]  lactulose (CHRONULAC) 10 GM/15ML solution Take 30 g by mouth daily. 04/26/19  Yes [provider]  methadone (DOLOPHINE) 10 MG tablet Take 10-50 mg by mouth See admin instructions. Take 5 tablets (50 mg totally) by mouth in the morning; take 1 tablet (10 mg totally) in the evening 04/25/19 05/01/19 Yes [provider]  oxyCODONE 10 MG TABS Take 1-2 tablets (10-20 mg total) by mouth every 6 (six) hours as needed (10mg  for mild pain, 15mg  for moderate pain, 20mg  for severe pain). 01/05/16  Yes Riebock, Emina, NP  polyethylene glycol (MIRALAX / GLYCOLAX) packet Take 17 g by mouth daily. 01/05/16  Yes Riebock, Emina, NP  promethazine (PHENERGAN) 25 MG tablet Take 25 mg by mouth every 6 (six) hours as needed for nausea  or vomiting.   Yes [provider]  QUEtiapine (SEROQUEL) 300 MG tablet Take 300 mg by mouth 2 (two) times daily. 04/25/19  Yes [provider]  traZODone (DESYREL) 50 MG tablet Take 50 mg by mouth at bedtime. 04/25/19  Yes [provider]  methocarbamol (ROBAXIN) 500 MG tablet Take 2 tablets (1,000 mg total) by mouth every 8 (eight) hours as needed for muscle spasms (or pain). Patient not taking: Reported on 04/26/2019 01/05/16   Ashok Norrisiebock, Emina, NP  naproxen (NAPROSYN) 500 MG tablet Take 1 tablet (500 mg total) by mouth 2 (two)  times daily with a meal. Patient not taking: Reported on 04/26/2019 01/05/16   Ashok Norrisiebock, Emina, NP    Family History History reviewed. No pertinent family history.  Social History Social History   Tobacco Use  . Smoking status: Current Every Day Smoker  Substance Use Topics  . Alcohol use: No  . Drug use: Yes    Types: Cocaine, IV    Comment: heroin      Allergies   Patient has no known allergies.   Review of Systems Review of Systems  Constitutional: Negative for fever.  HENT: Negative for rhinorrhea and sore throat.   Eyes: Negative for redness.  Respiratory: Negative for cough.   Cardiovascular: Negative for chest pain.  Gastrointestinal: Negative for abdominal pain, diarrhea, nausea and vomiting.  Genitourinary: Negative for dysuria.  Musculoskeletal: Positive for back pain. Negative for myalgias.  Skin: Negative for rash.  Neurological: Negative for headaches.     Physical Exam Updated Vital Signs BP 99/67 (BP Location: Left Arm)   Pulse 80   Temp 98.7 F (37.1 C) (Oral)   Resp 16   SpO2 97%   Physical Exam Vitals signs and nursing note reviewed.  Constitutional:      Appearance: She is well-developed.  HENT:     Head: Normocephalic and atraumatic.  Eyes:     General:        Right eye: No discharge.        Left eye: No discharge.     Conjunctiva/sclera: Conjunctivae normal.  Neck:     Musculoskeletal: Normal range of motion and neck supple.  Cardiovascular:     Rate and Rhythm: Normal rate and regular rhythm.     Heart sounds: Normal heart sounds.  Pulmonary:     Effort: Pulmonary effort is normal.     Breath sounds: Normal breath sounds.  Abdominal:     Palpations: Abdomen is soft.     Tenderness: There is no abdominal tenderness.  Musculoskeletal:     Comments: PICC line in place. Back brace at bedside, patient not wearing.   Skin:    General: Skin is warm and dry.  Neurological:     Mental Status: She is alert and oriented to person,  place, and time.     Comments: Patient is very sleepy and nodding off slightly during questioning.  She is tearful.      ED Treatments / Results  Labs (all labs ordered are listed, but only abnormal results are displayed) Labs Reviewed  CBC WITH DIFFERENTIAL/PLATELET - Abnormal; Notable for the following components:      Result Value   RBC 3.56 (*)    Hemoglobin 9.0 (*)    HCT 31.2 (*)    MCH 25.3 (*)    MCHC 28.8 (*)    RDW 17.3 (*)    Platelets 403 (*)    All other components within normal limits  BASIC METABOLIC  PANEL - Abnormal; Notable for the following components:   CO2 20 (*)    Calcium 8.6 (*)    All other components within normal limits    EKG None  Radiology No results found.  Procedures Procedures (including critical care time)  Medications Ordered in ED Medications  traZODone (DESYREL) tablet 50 mg (50 mg Oral Given 04/26/19 2258)  QUEtiapine (SEROQUEL) tablet 300 mg (300 mg Oral Given 04/26/19 2259)  gabapentin (NEURONTIN) capsule 600 mg (600 mg Oral Given 04/26/19 2259)  docusate sodium (COLACE) capsule 100 mg (has no administration in time range)  vancomycin (VANCOCIN) IVPB 1000 mg/200 mL premix (has no administration in time range)  oxyCODONE (Oxy IR/ROXICODONE) immediate release tablet 10-20 mg (20 mg Oral Given 04/27/19 0007)  vancomycin (VANCOCIN) IVPB 1000 mg/200 mL premix (0 mg Intravenous Stopped 04/26/19 1939)  sodium chloride 0.9 % bolus 500 mL (0 mLs Intravenous Stopped 04/26/19 2020)     Initial Impression / Assessment and Plan / ED Course  I have reviewed the triage vital signs and the nursing notes.  Pertinent labs & imaging results that were available during my care of the patient were reviewed by me and considered in my medical decision making (see chart for details).        Patient seen and examined.  Labs ordered.  This is mainly a social issue.  I called the patient skilled nursing facility to corroborate history as above.  Currently the  patient does not have a place to go for treatment.  She will be given her p.m. dose of vancomycin here.  Social work not currently present.  Secretary placed a page for on-call case management.  BP in the 90's systolic at time of d/c [facility] yesterday.   Vital signs reviewed and are as follows: BP (!) 89/63   Pulse 78   Temp 98.7 F (37.1 C) (Oral)   Resp 16   SpO2 96%   12:12 AM patient more awake.  She is upset that she is not receiving her full list of treatments.  I have ordered trazodone, gabapentin, Seroquel, oxycodone 20 mg.  I discussed with patient that she is not in inpatient rehab or inpatient hospital.  I explained that I am willing to make sure that she gets her IV antibiotics as well as the above medications and be seen by social work/case management in the morning to further discuss placement.  [facility]-C at shift change who will be aware of patient.  Final Clinical Impressions(s) / ED Diagnoses   Final diagnoses:  Acute osteomyelitis of thoracic spine (HCC)  Discitis of thoracic region  IVDU (intravenous drug user)   History as above.  Patient is now here with placement issue.  She is stable.  She has received IV antibiotics in the emergency department and has morning dose scheduled.  Given that it is a Saturday night, social work and case management services unavailable.  Patient will need to be seen in the morning.  ED Discharge Orders    None       Renne CriglerGeiple, Stevon Gough, PA-C 04/27/19 0015    Margarita Grizzleay, Danielle, MD 04/28/19 [phone]

## 2019-04-27 ENCOUNTER — Emergency Department (HOSPITAL_COMMUNITY): Payer: Self-pay

## 2019-04-27 MED ORDER — LINEZOLID 600 MG PO TABS: 600.0000 mg | ORAL_TABLET | Freq: Two times a day (BID) | ORAL | 0 refills | Status: AC

## 2019-04-27 MED ORDER — LINEZOLID 600 MG PO TABS: 600.0000 mg | ORAL_TABLET | Freq: Two times a day (BID) | ORAL | 0 refills | Status: DC

## 2019-04-27 NOTE — Care Management (Signed)
CM and EDP met with patient at bedside to discuss transitional care option recommended by [facility] on call ID Provider, to start on Zyvox PO and have patient follow up at the ID clinic as soon as possible. Patient is uninsured ED CM will provide MATCH medication assistance, CM checked to compare prices [facility] was $400 the best price for a 10 day supply patient agreeable and states she can have her mother make a stop at  [place] once she comes to pick her up.  Prescription and MATCH letter were sent to [facility] on [facility] and is ready for pick up.  CM noted that patient follow at [facility], placed information on AVS for patient to call to schedule PCP follow up appointment.  No further ED CM needs identified.

## 2019-04-27 NOTE — ED Provider Notes (Addendum)
Care assumed by previous provider, PA [facility].  Patient awaiting social work and case management consultation this morning.  We will follow-up on their recommendations.   Discussed case with social work and case management. Very appreciative of their help with complex case. Unfortunately, she is unable to go back to her SNF. Case management has attempted, but unable to find a facility who will accept her for ongoing care. I discussed the case with [facility], Dr. Nicholos Johns. Specifically discussed concern of PICC line placement in this patient with history of IV drug use as well as best treatment options for her given the situation.  He agrees that if there is no placement available for her, then we should pull her PICC line.  Recommends starting her on 600 mg twice daily of Linezolid. I discussed this medication change with patient as well as risks / signs of serotonin syndome given her other medications. Will d/c trazadone prn bedtime to minimize risks of this. Stressed importance of ABX adherence of follow up with ID. She agrees to call in the morning. Case management has arranged financial assistance with ABX. Will pull picc and dc with plan as above.    Patient discussed with Dr. Maryan Rued who agrees with treatment plan.     Ward, Ozella Almond, PA-C 04/27/19 1318    Blanchie Dessert, MD 04/27/19 2105

## 2019-04-27 NOTE — ED Notes (Signed)
IV RN recommended to pt that she lay flat for 18min after PICC line removal.  Immediately after PICC line dc'd pt left the emergency dept.

## 2019-04-27 NOTE — ED Notes (Signed)
The p[t has kept her bp cuff and her other wires off most of the shift

## 2019-04-27 NOTE — ED Notes (Signed)
IV team updated via telephone on POC and reason for PICC removal and the need for it to be completed ASAP.

## 2019-04-27 NOTE — ED Notes (Signed)
Called [facility] per PA [facility] for ID On Call
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
## 2019-04-27 NOTE — ED Notes (Signed)
Security paged to assist as pt is attempting to leave with PICC line.  Pt is instructed to wait in her room for the IV RN. Pt informed that security is outside of her room.  Pt states "why are you doing this to me? You just want me to be in pain." This RN states that it is for safety reasons and that "we cannot allow you to leave with a PICC line that you do not need anymore."
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
## 2019-04-27 NOTE — ED Notes (Signed)
IV team at bedside to DC PICC.

## 2019-04-27 NOTE — ED Notes (Addendum)
Pt states pain is "high but she can tolerate" Pthas removed all monitoring equipment. Pt eating breakfast tray and tolerating well.

## 2019-04-27 NOTE — Discharge Instructions (Signed)
Go get your antibiotic from the pharmacy. It is extremely important that you take this medication as directed without missing any doses.   Call the infectious disease clinic with [facility] tomorrow morning to schedule a follow up appointment.   Return to ER for new symptoms, worsening symptoms or any additional concerns.

## 2019-04-27 NOTE — ED Notes (Signed)
Food given 

## 2019-04-27 NOTE — ED Notes (Signed)
Social worker at bedside.

## 2019-04-27 NOTE — ED Notes (Signed)
The p[t is resting   Food given around 0030

## 2019-04-27 NOTE — ED Notes (Signed)
Breakfast ordered 

## 2019-04-27 NOTE — Care Management (Signed)
ED CM discussed with Kathleen Frederick concerning possible transitional care plan for patient. Due to patient's SA history patient would not be a candidate for [facility] IV antibiotic via PICC line. Patient is also on methadone and was set up to receive services with [facility] on 8/28 as perr CSW at [facility].  Patient left SNF AMA with PICC line still in place,as per [facility] SW the facility will not accept her back, patient is uninsured.  EDP contacted on call ID at [facility] and Zyvox was recommended due to patient not being a candidate to continue IV antibiotics at home.

## 2019-12-26 IMAGING — DX PORTABLE CHEST - 1 VIEW
1 series · 1 of 1 positions shown · non-contrast
Comparison: [DATE] [DATE], [DATE], [DATE] [DATE], [DATE]

CLINICAL DATA: 38-year-old female status post PICC placement

EXAM:
PORTABLE CHEST 1 VIEW

[chest ap]
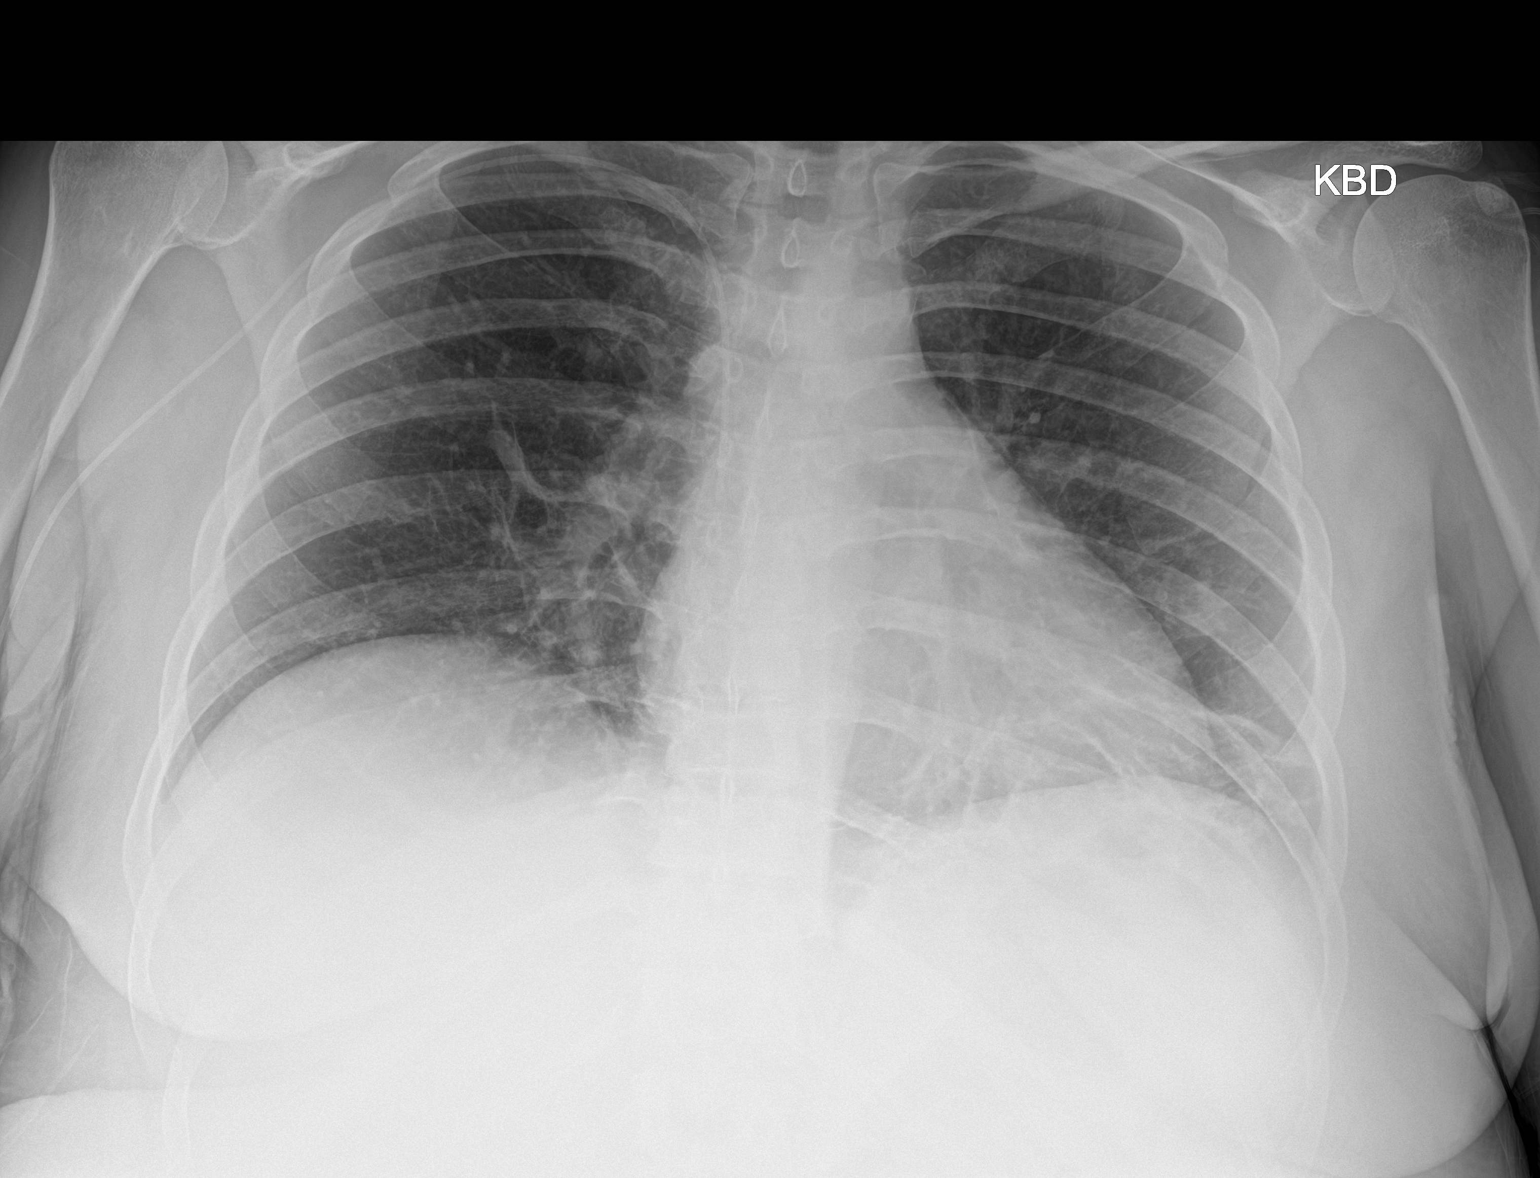

[1 of 1 positions shown; findings below may reference images not displayed]

FINDINGS: Cardiomediastinal silhouette unchanged in size and contour. Interval
removal of the endotracheal tube and gastric tube since the prior
plain film.

Interval placement of right upper extremity PICC with the tip of the
catheter appearing to terminate superior vena cava.

Low lung volumes with linear opacities at the lung bases. No
pneumothorax. No pleural effusion.
IMPRESSION: Low lung volumes with likely basilar atelectasis/scarring.

Right upper extremity PICC appears to terminate superior vena cava.

## 2022-06-28 ENCOUNTER — Emergency Department (HOSPITAL_COMMUNITY)
Admission: EM | Admit: 2022-06-28 | Discharge: 2022-06-29 | Discharge status: Against Medical Advice | Payer: Commercial Managed Care - HMO | Attending: Student | Admitting: Student

## 2022-06-28 ENCOUNTER — Emergency Department (HOSPITAL_COMMUNITY): Payer: Commercial Managed Care - HMO

## 2022-06-28 ENCOUNTER — Other Ambulatory Visit: Payer: Self-pay

## 2022-06-28 ENCOUNTER — Encounter (HOSPITAL_COMMUNITY): Payer: Self-pay

## 2022-06-28 DIAGNOSIS — R079 Chest pain, unspecified: Secondary | ICD-10-CM | POA: Diagnosis present

## 2022-06-28 DIAGNOSIS — Z5321 Procedure and treatment not carried out due to patient leaving prior to being seen by health care provider: Secondary | ICD-10-CM | POA: Diagnosis not present

## 2022-06-28 LAB — PREGNANCY, URINE: Preg Test, Ur: NEGATIVE

## 2022-06-28 LAB — CBC WITH DIFFERENTIAL/PLATELET
Abs Immature Granulocytes: 0.03 10*3/uL (ref 0.00–0.07)
Basophils Absolute: 0 10*3/uL (ref 0.0–0.1)
Basophils Relative: 0 %
Eosinophils Absolute: 0 10*3/uL (ref 0.0–0.5)
Eosinophils Relative: 0 %
HCT: 42.7 % (ref 36.0–46.0)
Hemoglobin: 13.9 g/dL (ref 12.0–15.0)
Immature Granulocytes: 0 %
Lymphocytes Relative: 18 %
Lymphs Abs: 2 10*3/uL (ref 0.7–4.0)
MCH: 26.7 pg (ref 26.0–34.0)
MCHC: 32.6 g/dL (ref 30.0–36.0)
MCV: 82.1 fL (ref 80.0–100.0)
Monocytes Absolute: 0.7 10*3/uL (ref 0.1–1.0)
Monocytes Relative: 6 %
Neutro Abs: 8.5 10*3/uL — ABNORMAL HIGH (ref 1.7–7.7)
Neutrophils Relative %: 76 %
Platelets: 314 10*3/uL (ref 150–400)
RBC: 5.2 MIL/uL — ABNORMAL HIGH (ref 3.87–5.11)
RDW: 14 % (ref 11.5–15.5)
WBC: 11.2 10*3/uL — ABNORMAL HIGH (ref 4.0–10.5)
nRBC: 0 % (ref 0.0–0.2)

## 2022-06-28 LAB — TROPONIN I (HIGH SENSITIVITY): Troponin I (High Sensitivity): <2 ng/L (ref <18)

## 2022-06-28 LAB — BRAIN NATRIURETIC PEPTIDE: B Natriuretic Peptide: 50.5 pg/mL (ref 0.0–100.0)
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
## 2022-06-28 NOTE — ED Provider Triage Note (Signed)
Emergency Medicine Provider Triage Evaluation Note  Kathleen Frederick , a 41 y.o. female  was evaluated in triage.  Pt complains of chest pain.  Onset 5 days ago.  Patient with history of polysubstance abuse and was recently started on Suboxone.  Patient reports the pain has progressively gotten worse.  Retrosternal.  Nonradiating.  Reports associated leg swelling or shortness of breath.  Did receive aspirin by EMS.  No history of DVT/PE.  No cardiac history..  Review of Systems  Positive: Chest pain, shortness breath, leg swelling Negative: Fevers  Physical Exam  There were no vitals taken for this visit. Gen:   Awake, no distress   Resp:  Normal effort  MSK:   Moves extremities without difficulty  Other:  Bilateral leg swelling.  Heart regular rate and rhythm.  Lungs clear to auscultation bilaterally.  Medical Decision Making  Medically screening exam initiated at 4:40 PM.  Appropriate orders placed.  Kathleen Frederick was informed that the remainder of the evaluation will be completed by another provider, this initial triage assessment does not replace that evaluation, and the importance of remaining in the ED until their evaluation is complete.  Cardiac work-up initiated.  Labs imaging pending at this time.   Kathleen Devoid, PA-C 06/28/22 1644

## 2022-06-29 DIAGNOSIS — R079 Chest pain, unspecified: Secondary | ICD-10-CM | POA: Diagnosis not present

## 2022-06-29 LAB — COMPREHENSIVE METABOLIC PANEL
ALT: 9 U/L (ref 0–44)
AST: 12 U/L — ABNORMAL LOW (ref 15–41)
Albumin: 3.5 g/dL (ref 3.5–5.0)
Alkaline Phosphatase: 77 U/L (ref 38–126)
Anion gap: 12 (ref 5–15)
BUN: 13 mg/dL (ref 6–20)
CO2: 18 mmol/L — ABNORMAL LOW (ref 22–32)
Calcium: 9 mg/dL (ref 8.9–10.3)
Chloride: 109 mmol/L (ref 98–111)
Creatinine, Ser: 0.72 mg/dL (ref 0.44–1.00)
GFR, Estimated: >60 mL/min (ref >60)
Glucose, Bld: 101 mg/dL — ABNORMAL HIGH (ref 70–99)
Potassium: 4 mmol/L (ref 3.5–5.1)
Sodium: 139 mmol/L (ref 135–145)
Total Bilirubin: 0.3 mg/dL (ref 0.3–1.2)
Total Protein: 7.5 g/dL (ref 6.5–8.1)

## 2022-06-29 LAB — TROPONIN I (HIGH SENSITIVITY): Troponin I (High Sensitivity): 4 ng/L (ref <18)

## 2022-06-29 NOTE — ED Notes (Signed)
Pt requesting to leave AMA. Risks vs benefits discussed and pt verbalized understanding. ED triage provider present at time of pt request to leave. Pt ambulatory to ED lobby in [facility] custody at time of departure.

## 2023-04-06 DIAGNOSIS — B9689 Other specified bacterial agents as the cause of diseases classified elsewhere: Secondary | ICD-10-CM | POA: Diagnosis not present

## 2023-04-06 DIAGNOSIS — F1190 Opioid use, unspecified, uncomplicated: Secondary | ICD-10-CM | POA: Diagnosis not present

## 2023-04-06 DIAGNOSIS — L03116 Cellulitis of left lower limb: Secondary | ICD-10-CM | POA: Diagnosis not present

## 2023-04-06 DIAGNOSIS — M4642 Discitis, unspecified, cervical region: Secondary | ICD-10-CM | POA: Diagnosis not present

## 2023-04-06 DIAGNOSIS — M4622 Osteomyelitis of vertebra, cervical region: Secondary | ICD-10-CM | POA: Diagnosis not present

## 2023-04-06 DIAGNOSIS — M273 Alveolitis of jaws: Secondary | ICD-10-CM | POA: Diagnosis not present

## 2023-04-06 DIAGNOSIS — L03119 Cellulitis of unspecified part of limb: Secondary | ICD-10-CM | POA: Diagnosis not present

## 2023-04-06 DIAGNOSIS — G629 Polyneuropathy, unspecified: Secondary | ICD-10-CM | POA: Diagnosis not present

## 2023-04-06 DIAGNOSIS — Z8619 Personal history of other infectious and parasitic diseases: Secondary | ICD-10-CM | POA: Diagnosis not present

## 2023-05-18 DIAGNOSIS — N3000 Acute cystitis without hematuria: Secondary | ICD-10-CM | POA: Diagnosis not present

## 2023-05-18 DIAGNOSIS — B1920 Unspecified viral hepatitis C without hepatic coma: Secondary | ICD-10-CM | POA: Diagnosis not present

## 2023-05-18 DIAGNOSIS — F1990 Other psychoactive substance use, unspecified, uncomplicated: Secondary | ICD-10-CM | POA: Diagnosis not present

## 2023-08-03 DIAGNOSIS — F1190 Opioid use, unspecified, uncomplicated: Secondary | ICD-10-CM | POA: Diagnosis not present

## 2023-08-03 DIAGNOSIS — H547 Unspecified visual loss: Secondary | ICD-10-CM | POA: Diagnosis not present

## 2023-08-03 DIAGNOSIS — K08109 Complete loss of teeth, unspecified cause, unspecified class: Secondary | ICD-10-CM | POA: Diagnosis not present

## 2023-08-07 DIAGNOSIS — M21612 Bunion of left foot: Secondary | ICD-10-CM | POA: Diagnosis not present

## 2023-09-21 DIAGNOSIS — F1190 Opioid use, unspecified, uncomplicated: Secondary | ICD-10-CM | POA: Diagnosis not present

## 2023-09-21 DIAGNOSIS — G629 Polyneuropathy, unspecified: Secondary | ICD-10-CM | POA: Diagnosis not present

## 2023-09-21 DIAGNOSIS — F1520 Other stimulant dependence, uncomplicated: Secondary | ICD-10-CM | POA: Diagnosis not present
